# Patient Record
Sex: Male | Born: 1935 | Race: White | Hispanic: No | Marital: Married | Smoking: Current every day smoker
Health system: Southern US, Community
[De-identification: ages and names within clinical notes are randomized; demographics above are authoritative.]

## PROBLEM LIST (undated history)

## (undated) DIAGNOSIS — F0390 Unspecified dementia without behavioral disturbance: Secondary | ICD-10-CM

## (undated) DIAGNOSIS — T7840XA Allergy, unspecified, initial encounter: Secondary | ICD-10-CM

## (undated) DIAGNOSIS — E785 Hyperlipidemia, unspecified: Secondary | ICD-10-CM

## (undated) DIAGNOSIS — J189 Pneumonia, unspecified organism: Secondary | ICD-10-CM

## (undated) DIAGNOSIS — I10 Essential (primary) hypertension: Secondary | ICD-10-CM

## (undated) DIAGNOSIS — K469 Unspecified abdominal hernia without obstruction or gangrene: Secondary | ICD-10-CM

## (undated) HISTORY — DX: Allergy, unspecified, initial encounter: T78.40XA

## (undated) HISTORY — PX: CATARACT EXTRACTION: SUR2

## (undated) HISTORY — DX: Unspecified dementia, unspecified severity, without behavioral disturbance, psychotic disturbance, mood disturbance, and anxiety: F03.90

## (undated) HISTORY — DX: Hyperlipidemia, unspecified: E78.5

## (undated) HISTORY — DX: Essential (primary) hypertension: I10

---

## 2010-12-03 ENCOUNTER — Encounter: Payer: Self-pay | Admitting: Emergency Medicine

## 2010-12-03 ENCOUNTER — Emergency Department (HOSPITAL_COMMUNITY)
Admission: EM | Admit: 2010-12-03 | Discharge: 2010-12-03 | Disposition: A | Discharge status: Home / Self Care | Payer: Medicare Other | Attending: Emergency Medicine | Admitting: Emergency Medicine

## 2010-12-03 DIAGNOSIS — Z87891 Personal history of nicotine dependence: Secondary | ICD-10-CM | POA: Insufficient documentation

## 2010-12-03 DIAGNOSIS — K4090 Unilateral inguinal hernia, without obstruction or gangrene, not specified as recurrent: Secondary | ICD-10-CM

## 2010-12-03 DIAGNOSIS — K4030 Unilateral inguinal hernia, with obstruction, without gangrene, not specified as recurrent: Secondary | ICD-10-CM | POA: Insufficient documentation

## 2010-12-03 HISTORY — DX: Unspecified abdominal hernia without obstruction or gangrene: K46.9

## 2010-12-03 HISTORY — DX: Pneumonia, unspecified organism: J18.9

## 2010-12-03 NOTE — ED Provider Notes (Signed)
History     CSN: [id number] Arrival date & time: 12/03/2010  2:16 PM   First MD Initiated Contact with Patient 12/03/10 1418      Chief Complaint  Patient presents with  . Hernia    (Consider location/radiation/quality/duration/timing/severity/associated sxs/prior treatment) HPI Comments: Pt with known right inguinal hernia and today came out to the size of a baseball and was hard and would not go back in and then when he got here it reduced on it's own.  Patient is a 77 y.o. male presenting with groin pain. The history is provided by the patient.  Groin Pain This is a new problem. The current episode started 1 to 2 hours ago. The problem occurs constantly. The problem has been resolved. Associated symptoms include abdominal pain. The symptoms are aggravated by nothing. The symptoms are relieved by nothing.    Past Medical History  Diagnosis Date  . Hernia   . Pneumonia     Past Surgical History  Procedure Date  . Cataract extraction     History reviewed. No pertinent family history.  History  Substance Use Topics  . Smoking status: Former Games developer  . Smokeless tobacco: Not on file  . Alcohol Use: No      Review of Systems  Gastrointestinal: Positive for abdominal pain. Negative for nausea, vomiting and diarrhea.  All other systems reviewed and are negative.    Allergies  Review of patient's allergies indicates no known allergies.  Home Medications  No current outpatient prescriptions on file.  BP 126/69  Pulse 71  Temp(Src) 97.5 F (36.4 C) (Oral)  Resp 20  Ht 6' (1.829 m)  Wt 164 lb (74.39 kg)  BMI 22.24 kg/m2  SpO2 99%  Physical Exam  Nursing note and vitals reviewed. Constitutional: He is oriented to person, place, and time. He appears well-developed and well-nourished. No distress.  HENT:  Head: Normocephalic and atraumatic.  Mouth/Throat: Oropharynx is clear and moist.  Eyes: Conjunctivae and EOM are normal. Pupils are equal, round, and  reactive to light.  Neck: Normal range of motion. Neck supple.  Abdominal: Soft. He exhibits no distension. There is no tenderness. There is no rebound and no guarding. A hernia is present. Hernia confirmed positive in the right inguinal area.       Hernia is easily reducible and no abdominal or inguinal pain  Musculoskeletal: Normal range of motion. He exhibits no edema and no tenderness.  Neurological: He is alert and oriented to person, place, and time.  Skin: Skin is warm and dry. No rash noted. No erythema.  Psychiatric: He has a normal mood and affect. His behavior is normal.    ED Course  Procedures (including critical care time)  Labs Reviewed - No data to display No results found.   No diagnosis found.    MDM   Pt presenting due to an incarcerated hernia however by the time he got back to a room it had reduced.  He now has no abd pain or groin pain.  Reducible inguinal hernia on the right side.  Will have f/u with Dr. Caesar Bookman for repair and gave precautions.       Gwyneth Sprout, MD 12/03/10 2137

## 2010-12-03 NOTE — ED Notes (Signed)
r inguinal hernia x 2 yrs with no complications until suddenly today. Pt staets it is the size of a baseball. Pt states he is in excruciating pain. Calm at times and moaning at times. Pain with movement.

## 2010-12-03 NOTE — ED Notes (Signed)
Pt anxious at times. Comfort measures given.

## 2013-06-29 ENCOUNTER — Ambulatory Visit (INDEPENDENT_AMBULATORY_CARE_PROVIDER_SITE_OTHER): Payer: Medicare Other

## 2013-06-29 VITALS — BP 112/74 | HR 95 | Resp 16 | Ht 72.0 in | Wt 164.0 lb

## 2013-06-29 DIAGNOSIS — G2581 Restless legs syndrome: Secondary | ICD-10-CM

## 2013-06-29 DIAGNOSIS — G609 Hereditary and idiopathic neuropathy, unspecified: Secondary | ICD-10-CM

## 2013-06-29 DIAGNOSIS — G309 Alzheimer's disease, unspecified: Secondary | ICD-10-CM

## 2013-06-29 DIAGNOSIS — F0280 Dementia in other diseases classified elsewhere without behavioral disturbance: Secondary | ICD-10-CM

## 2013-06-29 DIAGNOSIS — G629 Polyneuropathy, unspecified: Secondary | ICD-10-CM

## 2013-06-29 MED ORDER — GABAPENTIN 300 MG PO CAPS: ORAL_CAPSULE | ORAL | Status: DC

## 2013-06-29 NOTE — Patient Instructions (Signed)

## 2013-06-29 NOTE — Progress Notes (Signed)
   Subjective:    Patient ID: Sean Day, male    DOB: 1933-01-29, 80 y.o.   MRN: [id number]  HPI Comments: "My feet get real cold and then they get hot"  Patient c/o aching, burning, numbness, tingling in both feet. Says that the whole foot feels this way and even parts of his lower leg. He feels that they get really cold and then feels like they are on fire. Worse in the evenings. He saw PCP and he Rx'd gabapentin 100 mg every night. He's been taking this for a couple months now and says its not helping.     Review of Systems  Constitutional: Positive for fatigue.  HENT: Positive for tinnitus.   Respiratory: Positive for apnea.   Cardiovascular:       Calf pain with walking  Musculoskeletal: Positive for gait problem.  All other systems reviewed and are negative.      Objective:   Physical Exam 80 year old white male presents at this time well-developed well-nourished however somewhat disoriented very forgetful and unable to stay on focused discussion. Continues to repeat some the same things and did not understands all the explanations. Patient presents with his daughter-in-law present during the visit record instructions. Lower extremity objective findings as follows vascular status is intact with pedal pulses palpable DP postal for PT plus one over 4 bilateral capillary refill time 3 seconds all digits epicritic sensations intact the dorsum of foot ankle and legs however his loss sensation the plantar aspect of the foot digits and mid arch and heel area bilateral. Patient also has intact vibratory sensation although has significant hyperesthesia very brisk tendon reflexes patellar and Achilles tendon reflexes are brisk patient has some hypersensitivity at times throughout the entire visit constantly fidgeting in moving and rubbing his feet together. Findings likely consistent with restless leg syndrome possibly associated with his Alzheimer's. Patient describing cold temperatures  however is wearing for pairs of socks with a shoes which are making his shoes too tight advised no more than 2 pair of sock and time. Orthopedic biomechanical exam rectus foot type digit no digital contractures good range of motion muscle strengths appear to be normal patient worked as a Chartered certified accountant does not denies any history of injury trauma to the back knees or hip or legs there is nice exposure to chemicals in the past.       Assessment & Plan:  Assessment idiopathic or hereditary neuropathy as noted with hypersensitivity restless legs as well as loss of sensation plantarly plan at this time patient was started on low very low level of gabapentin and does not have any improvement I feel he could lead to increase her titrated to a higher level of her time at this time initiated and prescribed gabapentin 300 mg tablets starting from one at bedtime and noted to a day than 3 a day and recheck within 1-2 months for reevaluation of his neuropathy. Patient has difficult time understanding this however his daughter-in-law was aware of the instructions will help him with those followup in one to 2 months for reevaluation

## 2013-07-31 ENCOUNTER — Telehealth: Payer: Self-pay | Admitting: Neurology

## 2013-07-31 NOTE — Telephone Encounter (Signed)
Pt son would like to talk to you before the patient comes in please call Maurice MarchLane at [phone]

## 2013-08-02 ENCOUNTER — Encounter: Payer: Self-pay | Admitting: *Deleted

## 2013-08-03 ENCOUNTER — Ambulatory Visit: Payer: Medicare Other

## 2013-08-07 ENCOUNTER — Ambulatory Visit (INDEPENDENT_AMBULATORY_CARE_PROVIDER_SITE_OTHER): Payer: Medicare Other | Admitting: Neurology

## 2013-08-07 ENCOUNTER — Encounter: Payer: Self-pay | Admitting: Neurology

## 2013-08-07 ENCOUNTER — Telehealth: Payer: Self-pay | Admitting: Neurology

## 2013-08-07 VITALS — BP 138/90 | HR 83 | Resp 16 | Ht 70.0 in | Wt 147.0 lb

## 2013-08-07 DIAGNOSIS — G309 Alzheimer's disease, unspecified: Principal | ICD-10-CM

## 2013-08-07 DIAGNOSIS — F0280 Dementia in other diseases classified elsewhere without behavioral disturbance: Secondary | ICD-10-CM

## 2013-08-07 LAB — TSH: TSH: 1.661 u[IU]/mL (ref 0.350–4.500)

## 2013-08-07 MED ORDER — MEMANTINE HCL 28 X 5 MG & 21 X 10 MG PO TABS: ORAL_TABLET | ORAL | Status: AC
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
## 2013-08-07 NOTE — Telephone Encounter (Signed)
Pt called requesting to speak to a nurse. C/b [phone]

## 2013-08-07 NOTE — Telephone Encounter (Signed)
Left message for patient son to call back

## 2013-08-07 NOTE — Patient Instructions (Addendum)
1.  We will start memantine ER (Namenda XR).  Take as directed to goal of 28mg  daily.  Side effects include dizziness, headache, diarrhea or constipation.  Call with any questions or concerns. 2.  [facility]   08/08/13 at 11:15 am 3.  Check TSH 4.  No driving. 5.  He should have 24 hour care.  Either his daughter is with him or consider assisted living. 6.  Would try to taper off Xanax by one tablet per week until off of it. 7.  Call in 4 weeks.  If tolerating the Namenda, we can start seroquel for agitation, hallucinations and problems sleeping.  Note that there is a black box warning with antipsychotics regarding increased mortality, but it is still used often as there are really no other alternatives. 8.  Follow up in 3 months.

## 2013-08-08 ENCOUNTER — Telehealth: Payer: Self-pay | Admitting: Neurology

## 2013-08-08 ENCOUNTER — Ambulatory Visit (HOSPITAL_COMMUNITY): Payer: Medicare Other | Attending: Neurology

## 2013-08-08 NOTE — Telephone Encounter (Signed)
Pt  Daughter karen needs to talk to someone about appt please call [phone]

## 2013-08-08 NOTE — Telephone Encounter (Signed)
Left message for patient daughter to return call.  

## 2013-08-10 ENCOUNTER — Telehealth: Payer: Self-pay | Admitting: *Deleted

## 2013-08-10 NOTE — Telephone Encounter (Signed)
Need to talk to you about a prescription he got on 06/06.  I returned her call.  He had a spell. His feet were hurting him really bad.  He had stopped taking the gabapentin because I thought he was trying to tell me his feet were hurting worse.  So I had told him to stop taking it.  I spoke to the pharmacist today and he said to start him back on it because he may not have taken it long enough to get it into his system.  I asked when did he stop them.. She said probably about a week after he started taking them.  She said they brought some of that Folic spray to put on them.  She said the pharmacist had suggested maybe he take some Vitamin B.  Is there anything else he can try.  I told her no.  She said will a cortisone shot help him.  I told her no, not for Neuropathy.  She asked if he should apply a heating pad.  I advised her no, see how the medicine works for him.  She stated okay, thanks for calling.

## 2013-08-20 ENCOUNTER — Telehealth: Payer: Self-pay | Admitting: *Deleted

## 2013-08-20 NOTE — Telephone Encounter (Signed)
I attempted to return her call.  I left her a message to call me back. 

## 2013-08-20 NOTE — Telephone Encounter (Signed)
Want the nurse to call his daughter-in-law in regards to his medication.  Want to clarify correct way to take it.  There's some misunderstanding on his part.

## 2013-08-21 NOTE — Telephone Encounter (Signed)
He's taking foot medications, Gabapen.  I have a question about that.  This is the second week of him taking this medication two a day.  Give me a call.

## 2013-08-21 NOTE — Telephone Encounter (Signed)
I called and informed her that Dr. Ralene CorkSikora said that any medication you take as the possibility of causing nausea.  Make sure he takes it with food.  She stated I've been trying to tell daddy to eat food when he takes his medicine.  She asked what did he say about him driving.  I told her he said as long as he's not experiencing any drowsiness or dizziness he should be okay.  She asked does he still want to see him.  I told her yes.  I transferred her to a scheduler.

## 2013-08-21 NOTE — Telephone Encounter (Signed)
I returned her call.  She stated her dad is taking the Gabapentin twice a day now, one in the morning and one at night.  My question is, can the Gabapentin cause nausea?  I think he's doing better being on the medication.  Next week he will start taking 3 pills a day.  I told her I would have to ask Dr. Ralene CorkSikora.  Is it okay for him to drive taking this medicine?  I asked her how does it make him feel.  Does it make him drowsy or dizzy.  She stated no, he's fine.  I told her then he should be okay to drive.  She said all he does is drive up to the store or to her house, no where long distance.  Does he want to see him back?  I told her he wants to see him back in the next week or so.  She said well ask him that question and call me back.  Then, I will schedule him an appointment for the week of the 11th.

## 2013-08-27 ENCOUNTER — Encounter (HOSPITAL_COMMUNITY): Payer: Self-pay | Admitting: Emergency Medicine

## 2013-08-27 ENCOUNTER — Inpatient Hospital Stay (HOSPITAL_COMMUNITY)
Admission: EM | Admit: 2013-08-27 | Discharge: 2013-08-29 | DRG: 552 | Disposition: A | Discharge status: Home / Self Care | Payer: Medicare Other | Attending: Internal Medicine | Admitting: Internal Medicine

## 2013-08-27 ENCOUNTER — Emergency Department (HOSPITAL_COMMUNITY): Payer: Medicare Other

## 2013-08-27 DIAGNOSIS — Z681 Body mass index (BMI) 19 or less, adult: Secondary | ICD-10-CM | POA: Diagnosis not present

## 2013-08-27 DIAGNOSIS — F17200 Nicotine dependence, unspecified, uncomplicated: Secondary | ICD-10-CM | POA: Diagnosis present

## 2013-08-27 DIAGNOSIS — Y92009 Unspecified place in unspecified non-institutional (private) residence as the place of occurrence of the external cause: Secondary | ICD-10-CM | POA: Diagnosis present

## 2013-08-27 DIAGNOSIS — E785 Hyperlipidemia, unspecified: Secondary | ICD-10-CM | POA: Diagnosis present

## 2013-08-27 DIAGNOSIS — I10 Essential (primary) hypertension: Secondary | ICD-10-CM | POA: Diagnosis present

## 2013-08-27 DIAGNOSIS — W19XXXA Unspecified fall, initial encounter: Secondary | ICD-10-CM | POA: Diagnosis present

## 2013-08-27 DIAGNOSIS — F1310 Sedative, hypnotic or anxiolytic abuse, uncomplicated: Secondary | ICD-10-CM | POA: Diagnosis present

## 2013-08-27 DIAGNOSIS — S12100A Unspecified displaced fracture of second cervical vertebra, initial encounter for closed fracture: Secondary | ICD-10-CM

## 2013-08-27 DIAGNOSIS — F1010 Alcohol abuse, uncomplicated: Secondary | ICD-10-CM | POA: Diagnosis present

## 2013-08-27 DIAGNOSIS — F0390 Unspecified dementia without behavioral disturbance: Secondary | ICD-10-CM | POA: Diagnosis present

## 2013-08-27 DIAGNOSIS — Z7982 Long term (current) use of aspirin: Secondary | ICD-10-CM

## 2013-08-27 DIAGNOSIS — F0280 Dementia in other diseases classified elsewhere without behavioral disturbance: Secondary | ICD-10-CM

## 2013-08-27 DIAGNOSIS — G309 Alzheimer's disease, unspecified: Secondary | ICD-10-CM

## 2013-08-27 DIAGNOSIS — E440 Moderate protein-calorie malnutrition: Secondary | ICD-10-CM

## 2013-08-27 DIAGNOSIS — F0391 Unspecified dementia with behavioral disturbance: Secondary | ICD-10-CM

## 2013-08-27 LAB — BASIC METABOLIC PANEL
ANION GAP: 14 (ref 5–15)
BUN: 9 mg/dL (ref 6–23)
CHLORIDE: 99 meq/L (ref 96–112)
CO2: 25 meq/L (ref 19–32)
CREATININE: 0.93 mg/dL (ref 0.50–1.35)
Calcium: 9.2 mg/dL (ref 8.4–10.5)
GFR calc Af Amer: >90 mL/min (ref >90)
GFR calc non Af Amer: 79 mL/min — ABNORMAL LOW (ref >90)
Glucose, Bld: 154 mg/dL — ABNORMAL HIGH (ref 70–99)
POTASSIUM: 3.8 meq/L (ref 3.7–5.3)
Sodium: 138 mEq/L (ref 137–147)

## 2013-08-27 LAB — URINALYSIS, ROUTINE W REFLEX MICROSCOPIC
BILIRUBIN URINE: NEGATIVE
Glucose, UA: NEGATIVE mg/dL
HGB URINE DIPSTICK: NEGATIVE
Ketones, ur: NEGATIVE mg/dL
Leukocytes, UA: NEGATIVE
NITRITE: NEGATIVE
PH: 6.5 (ref 5.0–8.0)
Protein, ur: NEGATIVE mg/dL
SPECIFIC GRAVITY, URINE: 1.004 — AB (ref 1.005–1.030)
Urobilinogen, UA: 0.2 mg/dL (ref 0.0–1.0)

## 2013-08-27 LAB — CBC WITH DIFFERENTIAL/PLATELET
Basophils Absolute: 0 10*3/uL (ref 0.0–0.1)
Basophils Relative: 0 % (ref 0–1)
Eosinophils Absolute: 0 10*3/uL (ref 0.0–0.7)
Eosinophils Relative: 0 % (ref 0–5)
HEMATOCRIT: 40.8 % (ref 39.0–52.0)
HEMOGLOBIN: 14.8 g/dL (ref 13.0–17.0)
LYMPHS ABS: 1.7 10*3/uL (ref 0.7–4.0)
LYMPHS PCT: 15 % (ref 12–46)
MCH: 33.9 pg (ref 26.0–34.0)
MCHC: 36.3 g/dL — ABNORMAL HIGH (ref 30.0–36.0)
MCV: 93.6 fL (ref 78.0–100.0)
MONO ABS: 1 10*3/uL (ref 0.1–1.0)
MONOS PCT: 9 % (ref 3–12)
NEUTROS ABS: 8.5 10*3/uL — AB (ref 1.7–7.7)
Neutrophils Relative %: 76 % (ref 43–77)
Platelets: 183 10*3/uL (ref 150–400)
RBC: 4.36 MIL/uL (ref 4.22–5.81)
RDW: 12 % (ref 11.5–15.5)
WBC: 11.2 10*3/uL — AB (ref 4.0–10.5)

## 2013-08-27 MED ORDER — LORAZEPAM 2 MG/ML IJ SOLN
1.0000 mg | Freq: Once | INTRAMUSCULAR | Status: AC
Start: 2013-08-27 — End: 2013-08-27
  Administered 2013-08-27: 1 mg via INTRAVENOUS
  Filled 2013-08-27: qty 1
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
## 2013-08-27 NOTE — ED Notes (Signed)
Pt is agitated and wants to leave. Son arrived at bedside; pt agitated more. Explained to pt that we are trying to help him and that he needs to calm and let us help him. Pt agrees to stay for a while longer.

## 2013-08-27 NOTE — ED Notes (Signed)
Pt belongings bag given to son

## 2013-08-27 NOTE — H&P (Signed)
[facility] History and Physical  Sean Day ZOX:[id number] DOB: 12-Nov-1933 DOA: 08/27/2013  Referring physician: EDP PCP: Sean Day., MD   Chief Complaint: fall brought in by his son.   HPI: Sean Day is a 80 y.o. male with prior h/o hypertension hyperlipidemia, dementia, brought in by his son for a fall. Most of the history was obtained from the EDP and very less from the patient. No family at bedside. Called son at [phone], nobody picked up. On arrival to ED, he was found to have elevated white count of 11,000, normal TSH, and negative UA. He underwent ct head and cervical spine, showing Type 2 fracture of the dens with 3 mm posterior displacement of the dens. Cervical collar was put in but Patient repeatedly wanted to take the collar off. He reports taking alcohol earlier this am. Neuro surgery was consulted and he was referred to medical service for admission.     Review of Systems:  Confused, could not be obtained.   Past Medical History  Diagnosis Date  . Hernia   . Pneumonia   . Hyperlipidemia   . Hypertension   . Allergy   . Dementia    Past Surgical History  Procedure Laterality Date  . Cataract extraction     Social History:  reports that he has been smoking Cigars.  He does not have any smokeless tobacco history on file. He reports that he does not drink alcohol or use illicit drugs.  No Known Allergies  No family history on file.   Prior to Admission medications   Medication Sig Start Date End Date Taking? Authorizing Provider  ALPRAZolam Prudy Feeler) 1 MG tablet Take 0.5 mg by mouth 4 (four) times daily as needed. For anxiety   Yes Historical Provider, MD  amLODipine (NORVASC) 10 MG tablet Take 10 mg by mouth daily.   Yes Historical Provider, MD  busPIRone (BUSPAR) 5 MG tablet Take 5 mg by mouth 2 (two) times daily.   Yes Historical Provider, MD  donepezil (ARICEPT) 10 MG tablet Take 10 mg by mouth at bedtime.   Yes Historical Provider, MD    gabapentin (NEURONTIN) 300 MG capsule Take 300 mg by mouth 3 (three) times daily.  06/29/13  Yes Richard Ralene Cork, DPM  pravastatin (PRAVACHOL) 20 MG tablet Take 20 mg by mouth daily.     Yes Historical Provider, MD  aspirin EC 81 MG tablet Take 81 mg by mouth daily.      Historical Provider, MD  memantine (NAMENDA TITRATION PAK) tablet pack 5 mg/day for =1 week; 5 mg twice daily for =1 week; 15 mg/day given in 5 mg and 10 mg separated doses for =1 week; then 10 mg twice daily 08/07/13   Cira Servant, DO   Physical Exam: Filed Vitals:   08/27/13 1742 08/27/13 2000 08/27/13 2030 08/27/13 2100  BP: 118/73 148/96 132/75 153/89  Pulse: 91 83  89  Temp: 98.3 F (36.8 C)     TempSrc: Oral     Resp: 20 18 18    SpO2: 95% 95%  96%    Wt Readings from Last 3 Encounters:  08/07/13 66.679 kg (147 lb)  06/29/13 74.39 kg (164 lb)  12/03/10 74.39 kg (164 lb)    General:  Appears calm and comfortable Eyes: PERRL, normal lids, irises & conjunctiva Neck: no LAD, masses or thyromegaly, cervical collar in place.  Cardiovascular: RRR, no m/r/g. No LE edema. Respiratory: CTA bilaterally, no w/r/r. Normal respiratory effort. Abdomen: soft, ntnd Skin:  no rash or induration seen on limited exam Musculoskeletal: grossly normal tone BUE/BLE Neurologic: confused, able to move all extremities, speech normal.           Labs on Admission:  Basic Metabolic Panel:  Recent Labs Lab 08/27/13 2040  NA 138  K 3.8  CL 99  CO2 25  GLUCOSE 154*  BUN 9  CREATININE 0.93  CALCIUM 9.2   Liver Function Tests: No results found for this basename: AST, ALT, ALKPHOS, BILITOT, PROT, ALBUMIN,  in the last 168 hours No results found for this basename: LIPASE, AMYLASE,  in the last 168 hours No results found for this basename: AMMONIA,  in the last 168 hours CBC:  Recent Labs Lab 08/27/13 2040  WBC 11.2*  NEUTROABS 8.5*  HGB 14.8  HCT 40.8  MCV 93.6  PLT 183   Cardiac Enzymes: No results found for  this basename: CKTOTAL, CKMB, CKMBINDEX, TROPONINI,  in the last 168 hours  BNP (last 3 results) No results found for this basename: PROBNP,  in the last 8760 hours CBG: No results found for this basename: GLUCAP,  in the last 168 hours  Radiological Exams on Admission: Dg Cervical Spine Complete  08/27/2013   CLINICAL DATA:  Pain post trauma  EXAM: CERVICAL SPINE  4+ VIEWS  COMPARISON:  None.  FINDINGS: All, lateral, open-mouth odontoid, and bilateral oblique views were obtained. There is a fracture through the base of the odontoid with mild posterior displacement of the odontoid with respect to the remainder of C2. No other fracture is apparent. Prevertebral soft tissues and predental space regions are normal. There is moderately severe disc space narrowing at C5-6 and C6-7. There is mild disc space narrowing at C3-4, C4-5, and C7-T1. There is facet hypertrophy to varying degrees at all levels consistent with multifocal osteoarthritis. There is carotid artery calcification bilaterally.  IMPRESSION: Displaced fracture of the base of the odontoid with displacement the odontoid posteriorly with respect to the remainder of C2. No other fractures. Multilevel osteoarthritic change. There is carotid artery calcification bilaterally.  Critical Value/emergent results were called by telephone at the time of interpretation on 08/27/2013 at 7:40 pm to Dr. Eber Hong , who verbally acknowledged these results.   Electronically Signed   By: Bretta Bang M.D.   On: 08/27/2013 19:40   Ct Head Wo Contrast  08/27/2013   CLINICAL DATA:  Fall.  Evaluate cervical fracture  EXAM: CT HEAD WITHOUT CONTRAST  CT CERVICAL SPINE WITHOUT CONTRAST  TECHNIQUE: Multidetector CT imaging of the head and cervical spine was performed following the standard protocol without intravenous contrast. Multiplanar CT image reconstructions of the cervical spine were also generated.  COMPARISON:  Cervical radiographs from today  FINDINGS: CT  HEAD FINDINGS  Generalized atrophy with chronic microvascular ischemia. Negative for acute infarct. Negative for intracranial hemorrhage or mass. Negative for skull fracture.  CT CERVICAL SPINE FINDINGS  Type 2 fracture of the dens with 3 mm posterior displacement of the dens. No significant spinal stenosis. No other acute fracture  Marked right foraminal encroachment due to uncinate spurring and facet hypertrophy. Moderate right foraminal encroachment at C4-5 due to spurring. Right foraminal narrowing at C5-6 due to spurring. Spondylosis with foraminal encroachment bilaterally at C6-7. 2 mm anterior slip C7-T1 secondary to facet degeneration.  IMPRESSION: No acute intracranial abnormality.  Type 2 fracture of the dens with 3 mm posterior displacement of the dens.   Electronically Signed   By: Marlan Palau M.D.   On:  08/27/2013 21:50   Ct Cervical Spine Wo Contrast  08/27/2013   CLINICAL DATA:  Fall.  Evaluate cervical fracture  EXAM: CT HEAD WITHOUT CONTRAST  CT CERVICAL SPINE WITHOUT CONTRAST  TECHNIQUE: Multidetector CT imaging of the head and cervical spine was performed following the standard protocol without intravenous contrast. Multiplanar CT image reconstructions of the cervical spine were also generated.  COMPARISON:  Cervical radiographs from today  FINDINGS: CT HEAD FINDINGS  Generalized atrophy with chronic microvascular ischemia. Negative for acute infarct. Negative for intracranial hemorrhage or mass. Negative for skull fracture.  CT CERVICAL SPINE FINDINGS  Type 2 fracture of the dens with 3 mm posterior displacement of the dens. No significant spinal stenosis. No other acute fracture  Marked right foraminal encroachment due to uncinate spurring and facet hypertrophy. Moderate right foraminal encroachment at C4-5 due to spurring. Right foraminal narrowing at C5-6 due to spurring. Spondylosis with foraminal encroachment bilaterally at C6-7. 2 mm anterior slip C7-T1 secondary to facet degeneration.   IMPRESSION: No acute intracranial abnormality.  Type 2 fracture of the dens with 3 mm posterior displacement of the dens.   Electronically Signed   By: Marlan Palauharles  Clark M.D.   On: 08/27/2013 21:50    EKG: sinus rhythm  Assessment/Plan Active Problems:   C2 cervical fracture   Fall  1. Fall with C2 cervical fracture: - admit to neuro telemetry. Neurosurgery consulted.  - pain control as needed.  - probably secondary to alcohol intoxication.  - CIWA protocol ordered.  - cervical collar in place.   Code Status: full code DVT Prophylaxis: SCD'S Family Communication: none at bedside Disposition Plan: admit to neuro tele  Time spent: 60 min  [facility]ULA,Lindey Renzulli [facility] Pager [phone]  **Disclaimer: This note may have been dictated with voice recognition software. Similar sounding words can inadvertently be transcribed and this note may contain transcription errors which may not have been corrected upon publication of note.**

## 2013-08-27 NOTE — ED Provider Notes (Addendum)
CSN: [id number]     Arrival date & time 08/27/13  1730 History   First MD Initiated Contact with Patient 08/27/13 1751     Chief Complaint  Patient presents with  . Alcohol Intoxication     (Consider location/radiation/quality/duration/timing/severity/associated sxs/prior Treatment) HPI Comments: Pt comes in with cc of neck pain.  Per RN report: "Pt's son called EMS due to pt being intoxicated and having neck pain. EMS states the pt's son found the pt lying under his truck in order to get shade. The son states the pt complained of neck pain when getting up from the ground. EMS states the family is concerned about the pt's alcohol use and states he has had thoughts of hurting himself. The pt denies SI/HI and states he has only had 2-3 beers today. EMS states the pt was alert to person and place when they arrived. Pt is A&Ox4 and in NAD upon arrival."  Patient denies any SI, HI to me. He reports that he had 3 glasses of beer, and was laying out, relaxing. He has no psych hx. Pt has no neck pain, headaches and denies numbness, tingling.  Patient is a 80 y.o. male presenting with intoxication. The history is provided by the patient.  Alcohol Intoxication Pertinent negatives include no chest pain, no abdominal pain and no shortness of breath.    Past Medical History  Diagnosis Date  . Hernia   . Pneumonia   . Hyperlipidemia   . Hypertension   . Allergy   . Dementia    Past Surgical History  Procedure Laterality Date  . Cataract extraction     No family history on file. History  Substance Use Topics  . Smoking status: Current Every Day Smoker    Types: Cigars  . Smokeless tobacco: Not on file  . Alcohol Use: No    Review of Systems  Constitutional: Negative for activity change and appetite change.  Respiratory: Negative for cough and shortness of breath.   Cardiovascular: Negative for chest pain.  Gastrointestinal: Negative for abdominal pain.  Genitourinary: Negative for  dysuria.  Psychiatric/Behavioral: Negative for suicidal ideas and self-injury. The patient is not nervous/anxious.       Allergies  Review of patient's allergies indicates no known allergies.  Home Medications   Prior to Admission medications   Medication Sig Start Date End Date Taking? Authorizing Provider  ALPRAZolam Prudy Feeler) 1 MG tablet Take 0.5 mg by mouth 4 (four) times daily as needed. For anxiety   Yes Historical Provider, MD  amLODipine (NORVASC) 10 MG tablet Take 10 mg by mouth daily.   Yes Historical Provider, MD  busPIRone (BUSPAR) 5 MG tablet Take 5 mg by mouth 2 (two) times daily.   Yes Historical Provider, MD  donepezil (ARICEPT) 10 MG tablet Take 10 mg by mouth at bedtime.   Yes Historical Provider, MD  gabapentin (NEURONTIN) 300 MG capsule Take 300 mg by mouth 3 (three) times daily.  06/29/13  Yes Richard Ralene Cork, DPM  pravastatin (PRAVACHOL) 20 MG tablet Take 20 mg by mouth daily.     Yes Historical Provider, MD  aspirin EC 81 MG tablet Take 81 mg by mouth daily.      Historical Provider, MD  memantine [facility] TITRATION PAK) tablet pack 5 mg/day for =1 week; 5 mg twice daily for =1 week; 15 mg/day given in 5 mg and 10 mg separated doses for =1 week; then 10 mg twice daily 08/07/13   Cira Servant, DO   BP  153/89  Pulse 89  Temp(Src) 98.3 F (36.8 C) (Oral)  Resp 18  SpO2 96% Physical Exam  Nursing note and vitals reviewed. Constitutional: He is oriented to person, place, and time. He appears well-developed.  HENT:  Head: Normocephalic and atraumatic.  Eyes: Conjunctivae and EOM are normal. Pupils are equal, round, and reactive to light.  Neck: Normal range of motion. Neck supple.  No midline c-spine tenderness, pt able to turn head to 45 degrees bilaterally without any pain and able to flex neck to the chest and extend without any pain or neurologic symptoms.   Cardiovascular: Normal rate and regular rhythm.   Pulmonary/Chest: Effort normal and breath sounds  normal.  Abdominal: Soft. Bowel sounds are normal. He exhibits no distension. There is no tenderness. There is no rebound and no guarding.  Neurological: He is alert and oriented to person, place, and time.  Skin: Skin is warm.    ED Course  Procedures (including critical care time) Labs Review Labs Reviewed  CBC WITH DIFFERENTIAL - Abnormal; Notable for the following:    WBC 11.2 (*)    MCHC 36.3 (*)    Neutro Abs 8.5 (*)    All other components within normal limits  BASIC METABOLIC PANEL - Abnormal; Notable for the following:    Glucose, Bld 154 (*)    GFR calc non Af Amer 79 (*)    All other components within normal limits  URINALYSIS, ROUTINE W REFLEX MICROSCOPIC - Abnormal; Notable for the following:    Specific Gravity, Urine 1.004 (*)    All other components within normal limits    Imaging Review Dg Cervical Spine Complete  08/27/2013   CLINICAL DATA:  Pain post trauma  EXAM: CERVICAL SPINE  4+ VIEWS  COMPARISON:  None.  FINDINGS: All, lateral, open-mouth odontoid, and bilateral oblique views were obtained. There is a fracture through the base of the odontoid with mild posterior displacement of the odontoid with respect to the remainder of C2. No other fracture is apparent. Prevertebral soft tissues and predental space regions are normal. There is moderately severe disc space narrowing at C5-6 and C6-7. There is mild disc space narrowing at C3-4, C4-5, and C7-T1. There is facet hypertrophy to varying degrees at all levels consistent with multifocal osteoarthritis. There is carotid artery calcification bilaterally.  IMPRESSION: Displaced fracture of the base of the odontoid with displacement the odontoid posteriorly with respect to the remainder of C2. No other fractures. Multilevel osteoarthritic change. There is carotid artery calcification bilaterally.  Critical Value/emergent results were called by telephone at the time of interpretation on 08/27/2013 at 7:40 pm to Dr. Eber HongBRIAN MILLER ,  who verbally acknowledged these results.   Electronically Signed   By: Bretta BangWilliam  Woodruff M.D.   On: 08/27/2013 19:40   Ct Head Wo Contrast  08/27/2013   CLINICAL DATA:  Fall.  Evaluate cervical fracture  EXAM: CT HEAD WITHOUT CONTRAST  CT CERVICAL SPINE WITHOUT CONTRAST  TECHNIQUE: Multidetector CT imaging of the head and cervical spine was performed following the standard protocol without intravenous contrast. Multiplanar CT image reconstructions of the cervical spine were also generated.  COMPARISON:  Cervical radiographs from today  FINDINGS: CT HEAD FINDINGS  Generalized atrophy with chronic microvascular ischemia. Negative for acute infarct. Negative for intracranial hemorrhage or mass. Negative for skull fracture.  CT CERVICAL SPINE FINDINGS  Type 2 fracture of the dens with 3 mm posterior displacement of the dens. No significant spinal stenosis. No other acute fracture  Marked  right foraminal encroachment due to uncinate spurring and facet hypertrophy. Moderate right foraminal encroachment at C4-5 due to spurring. Right foraminal narrowing at C5-6 due to spurring. Spondylosis with foraminal encroachment bilaterally at C6-7. 2 mm anterior slip C7-T1 secondary to facet degeneration.  IMPRESSION: No acute intracranial abnormality.  Type 2 fracture of the dens with 3 mm posterior displacement of the dens.   Electronically Signed   By: Marlan Palau M.D.   On: 08/27/2013 21:50   Ct Cervical Spine Wo Contrast  08/27/2013   CLINICAL DATA:  Fall.  Evaluate cervical fracture  EXAM: CT HEAD WITHOUT CONTRAST  CT CERVICAL SPINE WITHOUT CONTRAST  TECHNIQUE: Multidetector CT imaging of the head and cervical spine was performed following the standard protocol without intravenous contrast. Multiplanar CT image reconstructions of the cervical spine were also generated.  COMPARISON:  Cervical radiographs from today  FINDINGS: CT HEAD FINDINGS  Generalized atrophy with chronic microvascular ischemia. Negative for acute  infarct. Negative for intracranial hemorrhage or mass. Negative for skull fracture.  CT CERVICAL SPINE FINDINGS  Type 2 fracture of the dens with 3 mm posterior displacement of the dens. No significant spinal stenosis. No other acute fracture  Marked right foraminal encroachment due to uncinate spurring and facet hypertrophy. Moderate right foraminal encroachment at C4-5 due to spurring. Right foraminal narrowing at C5-6 due to spurring. Spondylosis with foraminal encroachment bilaterally at C6-7. 2 mm anterior slip C7-T1 secondary to facet degeneration.  IMPRESSION: No acute intracranial abnormality.  Type 2 fracture of the dens with 3 mm posterior displacement of the dens.   Electronically Signed   By: Marlan Palau M.D.   On: 08/27/2013 21:50     EKG Interpretation None      MDM   Final diagnoses:  Dens fracture, closed, initial encounter  Dementia, with behavioral disturbance    Pt here for intoxication, ? SI, and neck pain. No neck pain for me. Xrays ordered - pt refusing. Removed collar. Will not listen to [place]. No midline cspine tenderness, and we agreed that we will remove the collar if he lets [place] get the xrays.  [phone]  - called the # in file, as patient states that his son in law called the EMS - and there is no response.   Patient is clinically sober. He is talking coherently, gait is normal, and is demonstrating rational thought process. He has no SI, HI.  Will discharge.  Derwood Kaplan, MD 08/27/13 1911  10:16 PM Pt got the Cspine xrays and has Dens fracrure, with displacement. Aspen collar on. Full neuro exam done again - and he is moving all 4, and has intact sensation and equal reflexes. Spoke with Dr. Threasa Heads, N'surgery - pt OK for d/c with soft collar. Close clinic f/u.  Spoke with son, who is also POA - and he is very uncomfortable taking his dad home. His father has been very uncooperative in the ER as well. Pt lives by himself, and refuses to live anywhere  else. Father also doesn't have 24/7 supervision, and in addition he is going through some intoxication issues and benzo withdrawals.  Pt will be admitted given his dementia, and inability to follow simple commands and high risk of falls. Medicine to admit. Nsuegery consulted.  CRITICAL CARE Performed by: Derwood Kaplan   Total critical care time: 90 minutes - FOR EXTENSIVE DISCUSSION WITH ALL PARTIES INVOLVED IN CARE OF MR. Koop AND HIS INJURY THAT CARRIES SUBSTANTIAL COMORBIDITIES IF IT BECOMES UNSTABLE.  Critical care time  was exclusive of separately billable procedures and treating other patients.  Critical care was necessary to treat or prevent imminent or life-threatening deterioration.  Critical care was time spent personally by me on the following activities: development of treatment plan with patient and/or surrogate as well as nursing, discussions with consultants, evaluation of patient's response to treatment, examination of patient, obtaining history from patient or surrogate, ordering and performing treatments and interventions, ordering and review of laboratory studies, ordering and review of radiographic studies, pulse oximetry and re-evaluation of patient's condition.    Derwood Kaplan, MD 08/27/13 2219

## 2013-08-28 DIAGNOSIS — E440 Moderate protein-calorie malnutrition: Secondary | ICD-10-CM | POA: Insufficient documentation

## 2013-08-28 DIAGNOSIS — F0391 Unspecified dementia with behavioral disturbance: Secondary | ICD-10-CM

## 2013-08-28 DIAGNOSIS — F03918 Unspecified dementia, unspecified severity, with other behavioral disturbance: Secondary | ICD-10-CM

## 2013-08-28 LAB — PROTIME-INR
INR: 1 (ref 0.00–1.49)
Prothrombin Time: 13.2 seconds (ref 11.6–15.2)

## 2013-08-28 LAB — ETHANOL: Alcohol, Ethyl (B): <11 mg/dL (ref 0–11)

## 2013-08-28 LAB — BASIC METABOLIC PANEL
Anion gap: 12 (ref 5–15)
BUN: 10 mg/dL (ref 6–23)
CHLORIDE: 102 meq/L (ref 96–112)
CO2: 26 mEq/L (ref 19–32)
Calcium: 9.2 mg/dL (ref 8.4–10.5)
Creatinine, Ser: 0.82 mg/dL (ref 0.50–1.35)
GFR calc Af Amer: >90 mL/min (ref >90)
GFR calc non Af Amer: 83 mL/min — ABNORMAL LOW (ref >90)
GLUCOSE: 105 mg/dL — AB (ref 70–99)
POTASSIUM: 4 meq/L (ref 3.7–5.3)
Sodium: 140 mEq/L (ref 137–147)

## 2013-08-28 LAB — CBC
HCT: 41.7 % (ref 39.0–52.0)
HEMOGLOBIN: 14.9 g/dL (ref 13.0–17.0)
MCH: 33.8 pg (ref 26.0–34.0)
MCHC: 35.7 g/dL (ref 30.0–36.0)
MCV: 94.6 fL (ref 78.0–100.0)
Platelets: 187 10*3/uL (ref 150–400)
RBC: 4.41 MIL/uL (ref 4.22–5.81)
RDW: 12.2 % (ref 11.5–15.5)
WBC: 11.6 10*3/uL — ABNORMAL HIGH (ref 4.0–10.5)

## 2013-08-28 MED ORDER — SIMVASTATIN 5 MG PO TABS: 10.0000 mg | ORAL_TABLET | Freq: Every day | ORAL | Status: DC

## 2013-08-28 MED ORDER — THIAMINE HCL 100 MG/ML IJ SOLN: 100.0000 mg | Freq: Every day | INTRAMUSCULAR | Status: DC

## 2013-08-28 MED ORDER — DONEPEZIL HCL 10 MG PO TABS
10.0000 mg | ORAL_TABLET | Freq: Every day | ORAL | Status: DC
  Administered 2013-08-28 (×2): 10 mg via ORAL
  Filled 2013-08-28 (×2): qty 1

## 2013-08-28 MED ORDER — ACETAMINOPHEN 650 MG RE SUPP: 650.0000 mg | Freq: Four times a day (QID) | RECTAL | Status: DC | PRN

## 2013-08-28 MED ORDER — ADULT MULTIVITAMIN W/MINERALS CH
1.0000 | ORAL_TABLET | Freq: Every day | ORAL | Status: DC
  Administered 2013-08-28 – 2013-08-29 (×2): 1 via ORAL
  Filled 2013-08-28 (×2): qty 1

## 2013-08-28 MED ORDER — SODIUM CHLORIDE 0.9 % IV SOLN
INTRAVENOUS | Status: DC
  Administered 2013-08-28: 1000 mL via INTRAVENOUS

## 2013-08-28 MED ORDER — ACETAMINOPHEN 325 MG PO TABS
650.0000 mg | ORAL_TABLET | Freq: Four times a day (QID) | ORAL | Status: DC | PRN
  Administered 2013-08-29: 650 mg via ORAL
  Filled 2013-08-28: qty 2

## 2013-08-28 MED ORDER — VITAMIN B-1 100 MG PO TABS
100.0000 mg | ORAL_TABLET | Freq: Every day | ORAL | Status: DC
  Administered 2013-08-28 – 2013-08-29 (×2): 100 mg via ORAL
  Filled 2013-08-28 (×2): qty 1

## 2013-08-28 MED ORDER — ALPRAZOLAM 0.5 MG PO TABS
0.5000 mg | ORAL_TABLET | Freq: Four times a day (QID) | ORAL | Status: DC | PRN
  Administered 2013-08-28: 0.5 mg via ORAL
  Filled 2013-08-28: qty 1

## 2013-08-28 MED ORDER — LORAZEPAM 1 MG PO TABS: 1.0000 mg | ORAL_TABLET | Freq: Four times a day (QID) | ORAL | Status: DC | PRN

## 2013-08-28 MED ORDER — ENSURE COMPLETE PO LIQD
237.0000 mL | Freq: Three times a day (TID) | ORAL | Status: DC
  Administered 2013-08-28: 237 mL via ORAL

## 2013-08-28 MED ORDER — AMLODIPINE BESYLATE 10 MG PO TABS
10.0000 mg | ORAL_TABLET | Freq: Every day | ORAL | Status: DC
  Administered 2013-08-28 – 2013-08-29 (×2): 10 mg via ORAL
  Filled 2013-08-28 (×2): qty 1

## 2013-08-28 MED ORDER — BUSPIRONE HCL 10 MG PO TABS
5.0000 mg | ORAL_TABLET | Freq: Two times a day (BID) | ORAL | Status: DC
  Administered 2013-08-28 – 2013-08-29 (×4): 5 mg via ORAL
  Filled 2013-08-28 (×4): qty 1

## 2013-08-28 MED ORDER — FOLIC ACID 1 MG PO TABS
1.0000 mg | ORAL_TABLET | Freq: Every day | ORAL | Status: DC
  Administered 2013-08-28 – 2013-08-29 (×2): 1 mg via ORAL
  Filled 2013-08-28 (×2): qty 1

## 2013-08-28 MED ORDER — MORPHINE SULFATE 2 MG/ML IJ SOLN
1.0000 mg | INTRAMUSCULAR | Status: DC | PRN
  Administered 2013-08-28 – 2013-08-29 (×4): 1 mg via INTRAVENOUS
  Filled 2013-08-28 (×3): qty 1

## 2013-08-28 MED ORDER — GABAPENTIN 300 MG PO CAPS
300.0000 mg | ORAL_CAPSULE | Freq: Three times a day (TID) | ORAL | Status: DC
  Administered 2013-08-28 – 2013-08-29 (×3): 300 mg via ORAL
  Filled 2013-08-28 (×3): qty 1

## 2013-08-28 MED ORDER — LORAZEPAM 2 MG/ML IJ SOLN
1.0000 mg | Freq: Four times a day (QID) | INTRAMUSCULAR | Status: DC | PRN
  Administered 2013-08-28 – 2013-08-29 (×3): 1 mg via INTRAVENOUS
  Filled 2013-08-28 (×3): qty 1

## 2013-08-28 NOTE — Progress Notes (Signed)
UR complete.  Tamarah Bhullar RN, MSN 

## 2013-08-28 NOTE — Progress Notes (Signed)
[facility] PROGRESS NOTE Interim History: 80 y.o. male with prior h/o hypertension hyperlipidemia, dementia, brought in by his son for a fall.. On arrival to ED, he was found to have elevated white count of 11,000, normal TSH, and negative UA. He underwent ct head and cervical spine, showing Type 2 fracture of the dens with 3 mm posterior displacement of the dens.   Assessment/Plan: C2 cervical fracture - Awaiting neurosurgery recommendations (Dr. Threasa Heads spoke with ED). - pt consult pending. - pain control as needed.  - probably secondary to alcohol intoxication.  - CIWA protocol ordered.  - cervical collar in place.   ETOH USE: - monitor with CIWA, on thiamine and folate.  Code Status: full code  DVT Prophylaxis: SCD'S  Family Communication: none at bedside  Disposition Plan: admit to neuro tele    Consultants:  neurosurgery  Procedures:  CT neck and head  Antibiotics:  None  HPI/Subjective: Neck pain, but medication help with pain.  Objective: Filed Vitals:   08/27/13 2100 08/28/13 0001 08/28/13 0217 08/28/13 0702  BP: 153/89 145/90 137/72 149/87  Pulse: 89 78 89 88  Temp:  98.3 F (36.8 C) 98.5 F (36.9 C) 97.9 F (36.6 C)  TempSrc:  Oral Oral Oral  Resp:  18 18 18   Height:  6' (1.829 m)    Weight:  63.186 kg (139 lb 4.8 oz)    SpO2: 96% 96% 94% 97%   No intake or output data in the 24 hours ending 08/28/13 0841 Filed Weights   08/28/13 0001  Weight: 63.186 kg (139 lb 4.8 oz)    Exam:  General: Alert, awake, oriented x3. HEENT: No bruits, no goiter.  Heart: Regular rate and rhythm. Lungs: Good air movement, clear Abdomen: Soft, nontender, nondistended, positive bowel sounds.  Neuro: Grossly intact, nonfocal.   Data Reviewed: Basic Metabolic Panel:  Recent Labs Lab 08/27/13 2040 08/28/13 0050  NA 138 140  K 3.8 4.0  CL 99 102  CO2 25 26  GLUCOSE 154* 105*  BUN 9 10  CREATININE 0.93 0.82  CALCIUM 9.2 9.2   Liver  Function Tests: No results found for this basename: AST, ALT, ALKPHOS, BILITOT, PROT, ALBUMIN,  in the last 168 hours No results found for this basename: LIPASE, AMYLASE,  in the last 168 hours No results found for this basename: AMMONIA,  in the last 168 hours CBC:  Recent Labs Lab 08/27/13 2040 08/28/13 0050  WBC 11.2* 11.6*  NEUTROABS 8.5*  --   HGB 14.8 14.9  HCT 40.8 41.7  MCV 93.6 94.6  PLT 183 187   Cardiac Enzymes: No results found for this basename: CKTOTAL, CKMB, CKMBINDEX, TROPONINI,  in the last 168 hours BNP (last 3 results) No results found for this basename: PROBNP,  in the last 8760 hours CBG: No results found for this basename: GLUCAP,  in the last 168 hours  No results found for this or any previous visit (from the past 240 hour(s)).   Studies: Dg Cervical Spine Complete  08/27/2013   CLINICAL DATA:  Pain post trauma  EXAM: CERVICAL SPINE  4+ VIEWS  COMPARISON:  None.  FINDINGS: All, lateral, open-mouth odontoid, and bilateral oblique views were obtained. There is a fracture through the base of the odontoid with mild posterior displacement of the odontoid with respect to the remainder of C2. No other fracture is apparent. Prevertebral soft tissues and predental space regions are normal. There is moderately severe disc space narrowing at C5-6 and C6-7. There  is mild disc space narrowing at C3-4, C4-5, and C7-T1. There is facet hypertrophy to varying degrees at all levels consistent with multifocal osteoarthritis. There is carotid artery calcification bilaterally.  IMPRESSION: Displaced fracture of the base of the odontoid with displacement the odontoid posteriorly with respect to the remainder of C2. No other fractures. Multilevel osteoarthritic change. There is carotid artery calcification bilaterally.  Critical Value/emergent results were called by telephone at the time of interpretation on 08/27/2013 at 7:40 pm to Dr. Eber HongBRIAN MILLER , who verbally acknowledged these  results.   Electronically Signed   By: Bretta BangWilliam  Woodruff M.D.   On: 08/27/2013 19:40   Ct Head Wo Contrast  08/27/2013   CLINICAL DATA:  Fall.  Evaluate cervical fracture  EXAM: CT HEAD WITHOUT CONTRAST  CT CERVICAL SPINE WITHOUT CONTRAST  TECHNIQUE: Multidetector CT imaging of the head and cervical spine was performed following the standard protocol without intravenous contrast. Multiplanar CT image reconstructions of the cervical spine were also generated.  COMPARISON:  Cervical radiographs from today  FINDINGS: CT HEAD FINDINGS  Generalized atrophy with chronic microvascular ischemia. Negative for acute infarct. Negative for intracranial hemorrhage or mass. Negative for skull fracture.  CT CERVICAL SPINE FINDINGS  Type 2 fracture of the dens with 3 mm posterior displacement of the dens. No significant spinal stenosis. No other acute fracture  Marked right foraminal encroachment due to uncinate spurring and facet hypertrophy. Moderate right foraminal encroachment at C4-5 due to spurring. Right foraminal narrowing at C5-6 due to spurring. Spondylosis with foraminal encroachment bilaterally at C6-7. 2 mm anterior slip C7-T1 secondary to facet degeneration.  IMPRESSION: No acute intracranial abnormality.  Type 2 fracture of the dens with 3 mm posterior displacement of the dens.   Electronically Signed   By: Marlan Palauharles  Clark M.D.   On: 08/27/2013 21:50   Ct Cervical Spine Wo Contrast  08/27/2013   CLINICAL DATA:  Fall.  Evaluate cervical fracture  EXAM: CT HEAD WITHOUT CONTRAST  CT CERVICAL SPINE WITHOUT CONTRAST  TECHNIQUE: Multidetector CT imaging of the head and cervical spine was performed following the standard protocol without intravenous contrast. Multiplanar CT image reconstructions of the cervical spine were also generated.  COMPARISON:  Cervical radiographs from today  FINDINGS: CT HEAD FINDINGS  Generalized atrophy with chronic microvascular ischemia. Negative for acute infarct. Negative for intracranial  hemorrhage or mass. Negative for skull fracture.  CT CERVICAL SPINE FINDINGS  Type 2 fracture of the dens with 3 mm posterior displacement of the dens. No significant spinal stenosis. No other acute fracture  Marked right foraminal encroachment due to uncinate spurring and facet hypertrophy. Moderate right foraminal encroachment at C4-5 due to spurring. Right foraminal narrowing at C5-6 due to spurring. Spondylosis with foraminal encroachment bilaterally at C6-7. 2 mm anterior slip C7-T1 secondary to facet degeneration.  IMPRESSION: No acute intracranial abnormality.  Type 2 fracture of the dens with 3 mm posterior displacement of the dens.   Electronically Signed   By: Marlan Palauharles  Clark M.D.   On: 08/27/2013 21:50    Scheduled Meds: . amLODipine  10 mg Oral Daily  . busPIRone  5 mg Oral BID  . donepezil  10 mg Oral QHS  . folic acid  1 mg Oral Daily  . gabapentin  300 mg Oral TID  . multivitamin with minerals  1 tablet Oral Daily  . simvastatin  10 mg Oral q1800  . thiamine  100 mg Oral Daily   Or  . thiamine  100 mg Intravenous  Daily   Continuous Infusions: . sodium chloride 1,000 mL (08/28/13 0030)     Marinda Elk  [facility] Pager [phone]. If 8PM-8AM, please contact night-coverage at [email or web address], password [facility] 08/28/2013, 8:41 AM  LOS: 1 day      **Disclaimer: This note may have been dictated with voice recognition software. Similar sounding words can inadvertently be transcribed and this note may contain transcription errors which may not have been corrected upon publication of note.**

## 2013-08-28 NOTE — Consult Note (Signed)
Reason for Consult:C2 Referring Physician: dilraj, killgore is an 80 y.o. male.  HPI: whom fell yesterday sustaining a C2 fracture without neurologic deficit. History of alcohol abuse, xanax abuse.   Past Medical History  Diagnosis Date  . Hernia   . Pneumonia   . Hyperlipidemia   . Hypertension   . Allergy   . Dementia     Past Surgical History  Procedure Laterality Date  . Cataract extraction      No family history on file.  Social History:  reports that he has been smoking Cigars.  He does not have any smokeless tobacco history on file. He reports that he does not drink alcohol or use illicit drugs.  Allergies: No Known Allergies  Medications: I have reviewed the patient's current medications.  Results for orders placed during the hospital encounter of 08/27/13 (from the past 48 hour(s))  CBC WITH DIFFERENTIAL     Status: Abnormal   Collection Time    08/27/13  8:40 PM      Result Value Ref Range   WBC 11.2 (*) 4.0 - 10.5 K/uL   RBC 4.36  4.22 - 5.81 MIL/uL   Hemoglobin 14.8  13.0 - 17.0 g/dL   HCT 40.8  39.0 - 52.0 %   MCV 93.6  78.0 - 100.0 fL   MCH 33.9  26.0 - 34.0 pg   MCHC 36.3 (*) 30.0 - 36.0 g/dL   RDW 12.0  11.5 - 15.5 %   Platelets 183  150 - 400 K/uL   Neutrophils Relative % 76  43 - 77 %   Neutro Abs 8.5 (*) 1.7 - 7.7 K/uL   Lymphocytes Relative 15  12 - 46 %   Lymphs Abs 1.7  0.7 - 4.0 K/uL   Monocytes Relative 9  3 - 12 %   Monocytes Absolute 1.0  0.1 - 1.0 K/uL   Eosinophils Relative 0  0 - 5 %   Eosinophils Absolute 0.0  0.0 - 0.7 K/uL   Basophils Relative 0  0 - 1 %   Basophils Absolute 0.0  0.0 - 0.1 K/uL  BASIC METABOLIC PANEL     Status: Abnormal   Collection Time    08/27/13  8:40 PM      Result Value Ref Range   Sodium 138  137 - 147 mEq/L   Potassium 3.8  3.7 - 5.3 mEq/L   Chloride 99  96 - 112 mEq/L   CO2 25  19 - 32 mEq/L   Glucose, Bld 154 (*) 70 - 99 mg/dL   BUN 9  6 - 23 mg/dL   Creatinine, Ser 0.93  0.50 - 1.35  mg/dL   Calcium 9.2  8.4 - 10.5 mg/dL   GFR calc non Af Amer 79 (*) >90 mL/min   GFR calc Af Amer >90  >90 mL/min   Comment: (NOTE)     The eGFR has been calculated using the CKD EPI equation.     This calculation has not been validated in all clinical situations.     eGFR's persistently <90 mL/min signify possible Chronic Kidney     Disease.   Anion gap 14  5 - 15  URINALYSIS, ROUTINE W REFLEX MICROSCOPIC     Status: Abnormal   Collection Time    08/27/13  9:31 PM      Result Value Ref Range   Color, Urine YELLOW  YELLOW   APPearance CLEAR  CLEAR   Specific Gravity, Urine 1.004 (*)  1.005 - 1.030   pH 6.5  5.0 - 8.0   Glucose, UA NEGATIVE  NEGATIVE mg/dL   Hgb urine dipstick NEGATIVE  NEGATIVE   Bilirubin Urine NEGATIVE  NEGATIVE   Ketones, ur NEGATIVE  NEGATIVE mg/dL   Protein, ur NEGATIVE  NEGATIVE mg/dL   Urobilinogen, UA 0.2  0.0 - 1.0 mg/dL   Nitrite NEGATIVE  NEGATIVE   Leukocytes, UA NEGATIVE  NEGATIVE   Comment: MICROSCOPIC NOT DONE ON URINES WITH NEGATIVE PROTEIN, BLOOD, LEUKOCYTES, NITRITE, OR GLUCOSE <1000 mg/dL.  BASIC METABOLIC PANEL     Status: Abnormal   Collection Time    08/28/13 12:50 AM      Result Value Ref Range   Sodium 140  137 - 147 mEq/L   Potassium 4.0  3.7 - 5.3 mEq/L   Chloride 102  96 - 112 mEq/L   CO2 26  19 - 32 mEq/L   Glucose, Bld 105 (*) 70 - 99 mg/dL   BUN 10  6 - 23 mg/dL   Creatinine, Ser 0.82  0.50 - 1.35 mg/dL   Calcium 9.2  8.4 - 10.5 mg/dL   GFR calc non Af Amer 83 (*) >90 mL/min   GFR calc Af Amer >90  >90 mL/min   Comment: (NOTE)     The eGFR has been calculated using the CKD EPI equation.     This calculation has not been validated in all clinical situations.     eGFR's persistently <90 mL/min signify possible Chronic Kidney     Disease.   Anion gap 12  5 - 15  CBC     Status: Abnormal   Collection Time    08/28/13 12:50 AM      Result Value Ref Range   WBC 11.6 (*) 4.0 - 10.5 K/uL   RBC 4.41  4.22 - 5.81 MIL/uL    Hemoglobin 14.9  13.0 - 17.0 g/dL   HCT 41.7  39.0 - 52.0 %   MCV 94.6  78.0 - 100.0 fL   MCH 33.8  26.0 - 34.0 pg   MCHC 35.7  30.0 - 36.0 g/dL   RDW 12.2  11.5 - 15.5 %   Platelets 187  150 - 400 K/uL  PROTIME-INR     Status: None   Collection Time    08/28/13 12:50 AM      Result Value Ref Range   Prothrombin Time 13.2  11.6 - 15.2 seconds   INR 1.00  0.00 - 1.49  ETHANOL     Status: None   Collection Time    08/28/13 12:50 AM      Result Value Ref Range   Alcohol, Ethyl (B) <11  0 - 11 mg/dL   Comment:            LOWEST DETECTABLE LIMIT FOR     SERUM ALCOHOL IS 11 mg/dL     FOR MEDICAL PURPOSES ONLY    Dg Cervical Spine Complete  08/27/2013   CLINICAL DATA:  Pain post trauma  EXAM: CERVICAL SPINE  4+ VIEWS  COMPARISON:  None.  FINDINGS: All, lateral, open-mouth odontoid, and bilateral oblique views were obtained. There is a fracture through the base of the odontoid with mild posterior displacement of the odontoid with respect to the remainder of C2. No other fracture is apparent. Prevertebral soft tissues and predental space regions are normal. There is moderately severe disc space narrowing at C5-6 and C6-7. There is mild disc space narrowing at C3-4, C4-5, and C7-T1.  There is facet hypertrophy to varying degrees at all levels consistent with multifocal osteoarthritis. There is carotid artery calcification bilaterally.  IMPRESSION: Displaced fracture of the base of the odontoid with displacement the odontoid posteriorly with respect to the remainder of C2. No other fractures. Multilevel osteoarthritic change. There is carotid artery calcification bilaterally.  Critical Value/emergent results were called by telephone at the time of interpretation on 08/27/2013 at 7:40 pm to Dr. Noemi Chapel , who verbally acknowledged these results.   Electronically Signed   By: Lowella Grip M.D.   On: 08/27/2013 19:40   Ct Head Wo Contrast  08/27/2013   CLINICAL DATA:  Fall.  Evaluate cervical  fracture  EXAM: CT HEAD WITHOUT CONTRAST  CT CERVICAL SPINE WITHOUT CONTRAST  TECHNIQUE: Multidetector CT imaging of the head and cervical spine was performed following the standard protocol without intravenous contrast. Multiplanar CT image reconstructions of the cervical spine were also generated.  COMPARISON:  Cervical radiographs from today  FINDINGS: CT HEAD FINDINGS  Generalized atrophy with chronic microvascular ischemia. Negative for acute infarct. Negative for intracranial hemorrhage or mass. Negative for skull fracture.  CT CERVICAL SPINE FINDINGS  Type 2 fracture of the dens with 3 mm posterior displacement of the dens. No significant spinal stenosis. No other acute fracture  Marked right foraminal encroachment due to uncinate spurring and facet hypertrophy. Moderate right foraminal encroachment at C4-5 due to spurring. Right foraminal narrowing at C5-6 due to spurring. Spondylosis with foraminal encroachment bilaterally at C6-7. 2 mm anterior slip C7-T1 secondary to facet degeneration.  IMPRESSION: No acute intracranial abnormality.  Type 2 fracture of the dens with 3 mm posterior displacement of the dens.   Electronically Signed   By: Franchot Gallo M.D.   On: 08/27/2013 21:50   Ct Cervical Spine Wo Contrast  08/27/2013   CLINICAL DATA:  Fall.  Evaluate cervical fracture  EXAM: CT HEAD WITHOUT CONTRAST  CT CERVICAL SPINE WITHOUT CONTRAST  TECHNIQUE: Multidetector CT imaging of the head and cervical spine was performed following the standard protocol without intravenous contrast. Multiplanar CT image reconstructions of the cervical spine were also generated.  COMPARISON:  Cervical radiographs from today  FINDINGS: CT HEAD FINDINGS  Generalized atrophy with chronic microvascular ischemia. Negative for acute infarct. Negative for intracranial hemorrhage or mass. Negative for skull fracture.  CT CERVICAL SPINE FINDINGS  Type 2 fracture of the dens with 3 mm posterior displacement of the dens. No  significant spinal stenosis. No other acute fracture  Marked right foraminal encroachment due to uncinate spurring and facet hypertrophy. Moderate right foraminal encroachment at C4-5 due to spurring. Right foraminal narrowing at C5-6 due to spurring. Spondylosis with foraminal encroachment bilaterally at C6-7. 2 mm anterior slip C7-T1 secondary to facet degeneration.  IMPRESSION: No acute intracranial abnormality.  Type 2 fracture of the dens with 3 mm posterior displacement of the dens.   Electronically Signed   By: Franchot Gallo M.D.   On: 08/27/2013 21:50    ROS Blood pressure 131/62, pulse 113, temperature 98.5 F (36.9 C), temperature source Oral, resp. rate 18, height 6' (1.829 m), weight 63.186 kg (139 lb 4.8 oz), SpO2 96.00%. Physical Exam  Constitutional: He is oriented to person, place, and time. He appears well-developed and well-nourished.  HENT:  Head: Normocephalic.  Right Ear: External ear normal.  Left Ear: External ear normal.  Eyes: Conjunctivae and EOM are normal. Pupils are equal, round, and reactive to light.  Neck:  In cervical collar  Respiratory: Effort  normal and breath sounds normal.  GI: Soft.  Neurological: He is alert and oriented to person, place, and time. He has normal strength. No cranial nerve deficit or sensory deficit. He displays no Babinski's sign on the right side. He displays no Babinski's sign on the left side.  Moving all extremities  Following commands Gait not assessed, moving upper extremities in a coordinated fashion  Psychiatric:  Can be combative, calm at the moment    Assessment/Plan: No surgical stabilization at this time. The cervical collar is sufficient treatment. I have spoken with his son and explained my rationale. Would be a good candidate for a xanax withdrawal program.   Shakenya Stoneberg L 08/28/2013, 8:00 PM

## 2013-08-28 NOTE — Progress Notes (Signed)
INITIAL NUTRITION ASSESSMENT  DOCUMENTATION CODES Per approved criteria  -Non-severe (moderate) malnutrition in the context of social or environmental circumstances  Pt meets criteria for MODERATE MALNUTRITION in the context of Social or Environmental Circumstances as evidenced by 15% weight loss in less than 2 months, moderate muscle wasting, and estimated energy intake < 75% of estimated energy intake for > 3 months .  INTERVENTION: Multivitamin with minerals, thiamine, and folic acid supplements already in place Provide Ensure Complete TID   NUTRITION DIAGNOSIS: Inadequate oral intake related to social issues and lack of taste as evidenced by 15% weight loss in less than 2 months.     Goal: Pt to meet >/= 90% of their estimated nutrition needs   Monitor:  PO intake, weight trend, labs  Reason for Assessment: Malnutrition screening Tool, score of 4  80 y.o. male  Admitting Dx: <principal problem not specified>  ASSESSMENT: 80 y.o. male with prior h/o hypertension hyperlipidemia, dementia, brought in by his son for a fall. Most of the history was obtained from the EDP and very less from the patient. On arrival to ED, he was found to have elevated white count of 11,000, normal TSH, and negative UA. He underwent ct head and cervical spine, showing Type 2 fracture of the dens with 3 mm posterior displacement of the dens. Cervical collar was put in but patient repeatedly wanted to take the collar off. He reports taking alcohol earlier this am.  Weight history shows that pt has lost 25 lbs, 15% of his body weight, in the past 2 months. Per nursing notes, pt consumed 100% of breakfast and lunch today. Pt reports that he used to weigh 165 lbs back in the winter time. He states that for the past several months he has been eating about 50% less than he used to due to lack of taste and due to life stress (he states he has had issues with his son since his son took away his car and car keys). Pt  states he recently started drinking Ensure supplements up to 3 times per day. He reports that his appetite is a little better today and he ate well.   Nutrition Focused Physical Exam:  Subcutaneous Fat:  Orbital Region: wnl Upper Arm Region: mild wasting Thoracic and Lumbar Region: NA  Muscle:  Temple Region: mild wasting Clavicle Bone Region: moderate wasting Clavicle and Acromion Bone Region: moderate wasting Scapular Bone Region: NA Dorsal Hand: mild wasting Patellar Region: moderate wasting Anterior Thigh Region: moderate wasting Posterior Calf Region: moderate wasting  Edema: none noted  Height: Ht Readings from Last 1 Encounters:  08/28/13 6' (1.829 m)    Weight: Wt Readings from Last 1 Encounters:  08/28/13 139 lb 4.8 oz (63.186 kg)    Ideal Body Weight: 178 lbs  % Ideal Body Weight: 78%  Wt Readings from Last 10 Encounters:  08/28/13 139 lb 4.8 oz (63.186 kg)  08/07/13 147 lb (66.679 kg)  06/29/13 164 lb (74.39 kg)  12/03/10 164 lb (74.39 kg)    Usual Body Weight: 164 lbs  % Usual Body Weight: 85%  BMI:  Body mass index is 18.89 kg/(m^2).  Estimated Nutritional Needs: Kcal: 1800-2000 Protein: 70-80 grams Fluid: 1.9 L/day  Skin: intact  Diet Order: Cardiac  EDUCATION NEEDS: -No education needs identified at this time   Intake/Output Summary (Last 24 hours) at 08/28/13 1411 Last data filed at 08/28/13 1341  Gross per 24 hour  Intake    480 ml  Output  600 ml  Net   -120 ml    Last BM: 8/3  Labs:   Recent Labs Lab 08/27/13 2040 08/28/13 0050  NA 138 140  K 3.8 4.0  CL 99 102  CO2 25 26  BUN 9 10  CREATININE 0.93 0.82  CALCIUM 9.2 9.2  GLUCOSE 154* 105*    CBG (last 3)  No results found for this basename: GLUCAP,  in the last 72 hours  Scheduled Meds: . amLODipine  10 mg Oral Daily  . busPIRone  5 mg Oral BID  . donepezil  10 mg Oral QHS  . folic acid  1 mg Oral Daily  . gabapentin  300 mg Oral TID  . multivitamin  with minerals  1 tablet Oral Daily  . simvastatin  10 mg Oral q1800  . thiamine  100 mg Oral Daily   Or  . thiamine  100 mg Intravenous Daily    Continuous Infusions: . sodium chloride 1,000 mL (08/28/13 0030)    Past Medical History  Diagnosis Date  . Hernia   . Pneumonia   . Hyperlipidemia   . Hypertension   . Allergy   . Dementia     Past Surgical History  Procedure Laterality Date  . Cataract extraction      Ian Malkin RD, LDN Inpatient Clinical Dietitian Pager: [phone] After Hours Pager: [phone]

## 2013-08-28 NOTE — Progress Notes (Signed)
Pt is agitated. CIWA score of 9, PRN Ativan 1mg  given. Will continue to monitor patient.

## 2013-08-29 DIAGNOSIS — E440 Moderate protein-calorie malnutrition: Secondary | ICD-10-CM

## 2013-08-29 MED ORDER — HYDROCODONE-ACETAMINOPHEN 5-325 MG PO TABS: 1.0000 | ORAL_TABLET | Freq: Four times a day (QID) | ORAL | Status: AC | PRN

## 2013-08-29 NOTE — Progress Notes (Signed)
Patient agitated  Walked up and down hall way with staff CIWA 9 1mg  ativan administered will continue to monitor.

## 2013-08-29 NOTE — Evaluation (Signed)
Occupational Therapy Evaluation Patient Details Name: Sean Day MRN: [id number] DOB: Jun 15, 1933 Today's Date: 08/29/2013    History of Present Illness 80 y.o. male whom fell yesterday sustaining a C2 fracture without neurologic deficit. History of alcohol abuse, xanax abuse.  Per neuro note No surgical stabilization at this time. The cervical collar is sufficient treatment.   Clinical Impression   Pt s/p above. Education provided to pt and family. Pt planning to d/c today.     Follow Up Recommendations  No OT follow up;Supervision/Assistance - 24 hour    Equipment Recommendations  None recommended by OT    Recommendations for Other Services       Precautions / Restrictions Precautions Precautions: Cervical;Fall Precaution Booklet Issued:  (given by PT) Precaution Comments: pt reports 3 falls in past 3 months; reviewed cervical precautions handout with pt; reviewed precautions Required Braces or Orthoses: Cervical Brace Cervical Brace: Hard collar;At all times Restrictions Weight Bearing Restrictions: No      Mobility Bed Mobility Overal bed mobility: Needs Assistance Bed Mobility: Rolling;Sidelying to Sit;Sit to Sidelying Rolling: Supervision Sidelying to sit: Supervision     Sit to sidelying: Supervision General bed mobility comments: cues for log roll technique.  Transfers Overall transfer level: Needs assistance Equipment used: 1 person hand held assist Transfers: Sit to/from Stand Sit to Stand: Supervision         General transfer comment: cues for hand placement.         ADL Overall ADL's : Needs assistance/impaired                     Lower Body Dressing: Supervision/safety;Sit to/from stand;Set up   Toilet Transfer: Supervision/safety;Ambulation;RW;Comfort height toilet;Grab bars           Functional mobility during ADLs: Supervision/safety;Rolling walker (ambulated a little without walker) General ADL Comments: Educated on  cervical collar and pads. Educated on safety tips for home (rugs, safe shoewear, use of bag on walker). Recommended sitting for LB ADLs. Educated on use of cup for teeth care and use of straw. Recommended someone be with pt for shower transfer. Educated on use of button up shirts.      Vision                     Perception     Praxis      Pertinent Vitals/Pain No apparent distress.     Hand Dominance     Extremity/Trunk Assessment Upper Extremity Assessment Upper Extremity Assessment: Overall WFL for tasks assessed   Lower Extremity Assessment Lower Extremity Assessment: Defer to PT evaluation   Cervical / Trunk Assessment Cervical / Trunk Assessment: Normal   Communication Communication Communication: No difficulties   Cognition Arousal/Alertness: Awake/alert Behavior During Therapy: Impulsive Overall Cognitive Status: History of cognitive impairments - at baseline     Safety/Judgement: Decreased awareness of safety     General Comments       Exercises       Shoulder Instructions      Home Living Family/patient expects to be discharged to:: Private residence Living Arrangements: Alone Available Help at Discharge: Family;Available 24 hours/day;Other (Comment) (daughter states they are trying to work it out) Type of Home: House Home Access: Stairs to enter Secretary/administratorntrance Stairs-Number of Steps: 1-2 Entrance Stairs-Rails: None Home Layout: One level     Bathroom Shower/Tub: Walk-in shower;Door   Foot LockerBathroom Toilet: Handicapped height     Home Equipment: [employer] Service[employer] CompanyCane - quad  Prior Functioning/Environment Level of Independence: Independent             OT Diagnosis:     OT Problem List:     OT Treatment/Interventions:      OT Goals(Current goals can be found in the care plan section) Acute Rehab OT Goals Patient Stated Goal: go home  OT Frequency:     Barriers to D/C:            Co-evaluation              End of Session  Equipment Utilized During Treatment: Rolling walker;Cervical collar Nurse Communication: Other (comment) (needs extra set of pads for collar; RW)  Activity Tolerance: Patient tolerated treatment well Patient left: in chair;with family/visitor present;with nursing/sitter in room   Time: 1331-1400 OT Time Calculation (min): 29 min Charges:  OT General Charges $OT Visit: 1 Procedure OT Evaluation $Initial OT Evaluation Tier I: 1 Procedure OT Treatments $Self Care/Home Management : 8-22 mins G-CodesEarlie Raveling OTR/L [phone] 08/29/2013, 2:32 PM

## 2013-08-29 NOTE — Evaluation (Signed)
Physical Therapy Evaluation Patient Details Name: Sean Day MRN: [id number] DOB: 1933/01/27 Today's Date: 08/29/2013   History of Present Illness  80 y.o. male whom fell yesterday sustaining a C2 fracture without neurologic deficit. History of alcohol abuse, xanax abuse.  Per neuro note No surgical stabilization at this time. The cervical collar is sufficient treatment.  Clinical Impression  Pt adm due to above. Presents with decr independence with gt and mobility and is a high fall risk due to decr safety awareness and balance deficits. Pt to benefit from skilled acute PT to address deficits below and maximize functional mobility prior to returning home. Spoke extensively with pt and MD regarding recommendations of 24/7 (A) from family upon D/C home due to high risk of falls. Pt reports his daughter can stay with him as needed. Will also recommend RW and HHPT upon acute D/C for safety and to improve balance. Pt will benefit from OT consult to address safety with ADLs.   Follow Up Recommendations Home health PT;Supervision/Assistance - 24 hour    Equipment Recommendations  Rolling walker with 5" wheels    Recommendations for Other Services OT consult     Precautions / Restrictions Precautions Precautions: Cervical;Fall Precaution Booklet Issued: Yes (comment) Precaution Comments: pt reports 3 falls in past 3 months; reviewed cervical precautions handout with pt Required Braces or Orthoses: Cervical Brace Cervical Brace: Hard collar;At all times Restrictions Weight Bearing Restrictions: No      Mobility  Bed Mobility Overal bed mobility: Needs Assistance Bed Mobility: Rolling;Sidelying to Sit Rolling: Supervision Sidelying to sit: Supervision       General bed mobility comments: cues for log rolling technique and to discontinue use of handdrails to simulate home envrioment   Transfers Overall transfer level: Needs assistance Equipment used: 1 person hand held  assist Transfers: Sit to/from Stand Sit to Stand: Min guard         General transfer comment: pt with sway with transfer to standing; min guard to steady  Ambulation/Gait Ambulation/Gait assistance: Min assist;Min guard Ambulation Distance (Feet): 175 Feet Assistive device: 1 person hand held assist Gait Pattern/deviations: Step-through pattern;Decreased stride length;Narrow base of support;Shuffle;Scissoring Gait velocity: initially impulsive; cues to slow down for safety    General Gait Details: pt very unsteady with gt; multiple LOB and required min (A) x 3 to maintain balance; pt easily distracted; could be due to prior balance deficits vs recent adm of ativan per RN; recommend use of RW to pt and to MD upon D/C home. will require 24/7 (A) initially due to high risk of falls; max cues for safety   Stairs Stairs: Yes Stairs assistance: Min guard Stair Management: No rails;Step to pattern;Forwards Number of Stairs: 3 General stair comments: max cues for safety and min guard to steady due to balance deficits on steps; will require (A) to ambulate steps into house   Wheelchair Mobility    Modified Rankin (Stroke Patients Only)       Balance Overall balance assessment: History of Falls;Needs assistance Sitting-balance support: Feet supported;No upper extremity supported Sitting balance-Leahy Scale: Good     Standing balance support: During functional activity;Single extremity supported Standing balance-Leahy Scale: Poor Standing balance comment: pt with sway; required handheld (A) to maintain balance              High level balance activites: Direction changes;Sudden stops;Turns High Level Balance Comments: min guard to min (A) to balance; pt with scissor like gt at times  Pertinent Vitals/Pain Pt adm ativan by RN prior to session; no c/o pain     Home Living Family/patient expects to be discharged to:: Private residence Living Arrangements:  Alone Available Help at Discharge: Family;Available 24 hours/day;Other (Comment) (reports he can have daughter stay with him) Type of Home: House Home Access: Stairs to enter Entrance Stairs-Rails: None Entrance Stairs-Number of Steps: 1-2 Home Layout: One level Home Equipment: Cane - quad      Prior Function Level of Independence: Independent               Hand Dominance        Extremity/Trunk Assessment   Upper Extremity Assessment: Defer to OT evaluation           Lower Extremity Assessment: Generalized weakness (c/o peripheral neuropathy )      Cervical / Trunk Assessment: Normal  Communication   Communication: No difficulties  Cognition Arousal/Alertness: Awake/alert Behavior During Therapy: Impulsive Overall Cognitive Status: No family/caregiver present to determine baseline cognitive functioning Area of Impairment: Following commands;Safety/judgement;Problem solving     Memory: Decreased recall of precautions Following Commands: Follows one step commands with increased time Safety/Judgement: Decreased awareness of safety;Decreased awareness of deficits   Problem Solving: Slow processing;Difficulty sequencing;Requires verbal cues;Requires tactile cues General Comments: pt unaware of balance deficits; continuously stated "i can do things on my own" but was requiring min guard to min (A) for mobility     General Comments      Exercises        Assessment/Plan    PT Assessment Patient needs continued PT services  PT Diagnosis Abnormality of gait;Generalized weakness;Acute pain   PT Problem List Decreased strength  PT Treatment Interventions DME instruction;Gait training;Stair training;Functional mobility training;Therapeutic activities;Therapeutic exercise;Balance training;Neuromuscular re-education;Patient/family education   PT Goals (Current goals can be found in the Care Plan section) Acute Rehab PT Goals Patient Stated Goal: go home now PT  Goal Formulation: With patient Time For Goal Achievement: 09/01/13 Potential to Achieve Goals: Good    Frequency Min 5X/week   Barriers to discharge Inaccessible home environment;Decreased caregiver support lives alone with STE; reports his daughter can stay with him if needed    Co-evaluation               End of Session Equipment Utilized During Treatment: Gait belt;Cervical collar Activity Tolerance: Patient tolerated treatment well Patient left: in chair;with call bell/phone within reach;with nursing/sitter in room Nurse Communication: Mobility status;Precautions         Time: 1010-1025 PT Time Calculation (min): 15 min   Charges:   PT Evaluation $Initial PT Evaluation Tier I: 1 Procedure PT Treatments $Gait Training: 8-22 mins   PT G CodesDonell Day:          Male Minish N, [place]PT  [phone] 08/29/2013, 12:08 PM

## 2013-08-29 NOTE — Discharge Summary (Addendum)
Physician Discharge Summary  Sean Day XBJ:[id number] DOB: 27-Jan-1933 DOA: 08/27/2013  PCP: Cassell SmilesFUSCO,LAWRENCE J., MD  Admit date: 08/27/2013 Discharge date: 08/29/2013  Time spent: 35 minutes  Recommendations for Outpatient Follow-up:  1. Follow up with PCP in 2 week. 2. NEurosurgery in 2-4 weeks.  Discharge Diagnoses:  Active Problems:   C2 cervical fracture   Fall   Malnutrition of moderate degree   Moderate protein malnutrition   Discharge Condition: Stable  Diet recommendation: heart healthy  Filed Weights   08/28/13 0001  Weight: 63.186 kg (139 lb 4.8 oz)    History of present illness:  80 y.o. male with prior h/o hypertension hyperlipidemia, dementia, brought in by his son for a fall. Most of the history was obtained from the EDP and very less from the patient. No family at bedside. Called son at [phone], nobody picked up. On arrival to ED, he was found to have elevated white count of 11,000, normal TSH, and negative UA. He underwent ct head and cervical spine, showing Type 2 fracture of the dens with 3 mm posterior displacement of the dens. Cervical collar was put in but Patient repeatedly wanted to take the collar off. He reports taking alcohol earlier this am. Neuro surgery was consulted and he was referred to medical service for admission.    Hospital Course:  C2 cervical fracture  - Neurosurgery recommended conservative management pain control and collar. - pain control as needed.  - Cervical collar in place.  - follow up with Neurosurgery as an outpatient  ETOH USE:  - monitor with CIWA, on thiamine and folate.   Procedures:  CT head and neck  Consultations:  neurosurgery  Discharge Exam: Filed Vitals:   08/29/13 0144  BP: 110/62  Pulse: 99  Temp: 98.3 F (36.8 C)  Resp: 16    General: A&O x3 Cardiovascular: RRR Respiratory: good air movement CTA B/l  Discharge Instructions You were cared for by a hospitalist during your hospital stay. If  you have any questions about your discharge medications or the care you received while you were in the hospital after you are discharged, you can call the unit and asked to speak with the hospitalist on call if the hospitalist that took care of you is not available. Once you are discharged, your primary care physician will handle any further medical issues. Please note that NO REFILLS for any discharge medications will be authorized once you are discharged, as it is imperative that you return to your primary care physician (or establish a relationship with a primary care physician if you do not have one) for your aftercare needs so that they can reassess your need for medications and monitor your lab values.      Discharge Instructions   Diet - low sodium heart healthy    Complete by:  As directed      Diet - low sodium heart healthy    Complete by:  As directed      Increase activity slowly    Complete by:  As directed      Increase activity slowly    Complete by:  As directed             Medication List         ALPRAZolam 1 MG tablet  Commonly known as:  XANAX  Take 0.5 mg by mouth 4 (four) times daily as needed. For anxiety     amLODipine 10 MG tablet  Commonly known as:  NORVASC  Take 10 mg by mouth daily.     aspirin EC 81 MG tablet  Take 81 mg by mouth daily.     busPIRone 5 MG tablet  Commonly known as:  BUSPAR  Take 5 mg by mouth 2 (two) times daily.     donepezil 10 MG tablet  Commonly known as:  ARICEPT  Take 10 mg by mouth at bedtime.     gabapentin 300 MG capsule  Commonly known as:  NEURONTIN  Take 300 mg by mouth 3 (three) times daily.     HYDROcodone-acetaminophen 5-325 MG per tablet  Commonly known as:  NORCO  Take 1 tablet by mouth every 6 (six) hours as needed for moderate pain.     memantine tablet pack  Commonly known as:  NAMENDA TITRATION PAK  5 mg/day for =1 week; 5 mg twice daily for =1 week; 15 mg/day given in 5 mg and 10 mg separated doses  for =1 week; then 10 mg twice daily     pravastatin 20 MG tablet  Commonly known as:  PRAVACHOL  Take 20 mg by mouth daily.       No Known Allergies Follow-up Information   Follow up with Cassell Smiles., MD In 2 weeks. (hospital follow up)    Specialty:  Internal Medicine   Contact information:   [address] [place] [address] [phone]        The results of significant diagnostics from this hospitalization (including imaging, microbiology, ancillary and laboratory) are listed below for reference.    Significant Diagnostic Studies: Dg Cervical Spine Complete  08/27/2013   CLINICAL DATA:  Pain post trauma  EXAM: CERVICAL SPINE  4+ VIEWS  COMPARISON:  None.  FINDINGS: All, lateral, open-mouth odontoid, and bilateral oblique views were obtained. There is a fracture through the base of the odontoid with mild posterior displacement of the odontoid with respect to the remainder of C2. No other fracture is apparent. Prevertebral soft tissues and predental space regions are normal. There is moderately severe disc space narrowing at C5-6 and C6-7. There is mild disc space narrowing at C3-4, C4-5, and C7-T1. There is facet hypertrophy to varying degrees at all levels consistent with multifocal osteoarthritis. There is carotid artery calcification bilaterally.  IMPRESSION: Displaced fracture of the base of the odontoid with displacement the odontoid posteriorly with respect to the remainder of C2. No other fractures. Multilevel osteoarthritic change. There is carotid artery calcification bilaterally.  Critical Value/emergent results were called by telephone at the time of interpretation on 08/27/2013 at 7:40 pm to Dr. Eber Hong , who verbally acknowledged these results.   Electronically Signed   By: Bretta Bang M.D.   On: 08/27/2013 19:40   Ct Head Wo Contrast  08/27/2013   CLINICAL DATA:  Fall.  Evaluate cervical fracture  EXAM: CT HEAD WITHOUT CONTRAST  CT CERVICAL SPINE  WITHOUT CONTRAST  TECHNIQUE: Multidetector CT imaging of the head and cervical spine was performed following the standard protocol without intravenous contrast. Multiplanar CT image reconstructions of the cervical spine were also generated.  COMPARISON:  Cervical radiographs from today  FINDINGS: CT HEAD FINDINGS  Generalized atrophy with chronic microvascular ischemia. Negative for acute infarct. Negative for intracranial hemorrhage or mass. Negative for skull fracture.  CT CERVICAL SPINE FINDINGS  Type 2 fracture of the dens with 3 mm posterior displacement of the dens. No significant spinal stenosis. No other acute fracture  Marked right foraminal encroachment due to uncinate spurring and facet hypertrophy. Moderate right  foraminal encroachment at C4-5 due to spurring. Right foraminal narrowing at C5-6 due to spurring. Spondylosis with foraminal encroachment bilaterally at C6-7. 2 mm anterior slip C7-T1 secondary to facet degeneration.  IMPRESSION: No acute intracranial abnormality.  Type 2 fracture of the dens with 3 mm posterior displacement of the dens.   Electronically Signed   By: Marlan Palau M.D.   On: 08/27/2013 21:50   Ct Cervical Spine Wo Contrast  08/27/2013   CLINICAL DATA:  Fall.  Evaluate cervical fracture  EXAM: CT HEAD WITHOUT CONTRAST  CT CERVICAL SPINE WITHOUT CONTRAST  TECHNIQUE: Multidetector CT imaging of the head and cervical spine was performed following the standard protocol without intravenous contrast. Multiplanar CT image reconstructions of the cervical spine were also generated.  COMPARISON:  Cervical radiographs from today  FINDINGS: CT HEAD FINDINGS  Generalized atrophy with chronic microvascular ischemia. Negative for acute infarct. Negative for intracranial hemorrhage or mass. Negative for skull fracture.  CT CERVICAL SPINE FINDINGS  Type 2 fracture of the dens with 3 mm posterior displacement of the dens. No significant spinal stenosis. No other acute fracture  Marked right  foraminal encroachment due to uncinate spurring and facet hypertrophy. Moderate right foraminal encroachment at C4-5 due to spurring. Right foraminal narrowing at C5-6 due to spurring. Spondylosis with foraminal encroachment bilaterally at C6-7. 2 mm anterior slip C7-T1 secondary to facet degeneration.  IMPRESSION: No acute intracranial abnormality.  Type 2 fracture of the dens with 3 mm posterior displacement of the dens.   Electronically Signed   By: Marlan Palau M.D.   On: 08/27/2013 21:50    Microbiology: No results found for this or any previous visit (from the past 240 hour(s)).   Labs: Basic Metabolic Panel:  Recent Labs Lab 08/27/13 2040 08/28/13 0050  NA 138 140  K 3.8 4.0  CL 99 102  CO2 25 26  GLUCOSE 154* 105*  BUN 9 10  CREATININE 0.93 0.82  CALCIUM 9.2 9.2   Liver Function Tests: No results found for this basename: AST, ALT, ALKPHOS, BILITOT, PROT, ALBUMIN,  in the last 168 hours No results found for this basename: LIPASE, AMYLASE,  in the last 168 hours No results found for this basename: AMMONIA,  in the last 168 hours CBC:  Recent Labs Lab 08/27/13 2040 08/28/13 0050  WBC 11.2* 11.6*  NEUTROABS 8.5*  --   HGB 14.8 14.9  HCT 40.8 41.7  MCV 93.6 94.6  PLT 183 187   Cardiac Enzymes: No results found for this basename: CKTOTAL, CKMB, CKMBINDEX, TROPONINI,  in the last 168 hours BNP: BNP (last 3 results) No results found for this basename: PROBNP,  in the last 8760 hours CBG: No results found for this basename: GLUCAP,  in the last 168 hours     Signed:  Marinda Elk  [facility] 08/29/2013, 9:42 AM

## 2013-08-29 NOTE — Progress Notes (Signed)
Paged MD regarding signing patient's scripts

## 2013-08-30 NOTE — Care Management Note (Signed)
    Page 1 of 1   08/30/2013     3:42:26 PM CARE MANAGEMENT NOTE 08/30/2013  Patient:  Sean Day,Sean Day   Account Number:  [id number]  Date Initiated:  08/28/2013  Documentation initiated by:  Elmer BalesOBARGE,COURTNEY  Subjective/Objective Assessment:   Patient admitted with C2 fracture, s/p fall. Lives at home alone     Action/Plan:   Will follow for discharge needs pending PT/OT evals and physician orders.   Anticipated DC Date:     Anticipated DC Plan:  HOME/SELF CARE         Choice offered to / List presented to:             Status of service:  In process, will continue to follow Medicare Important Message given?  NA - LOS <3 / Initial given by admissions (If response is "NO", the following Medicare IM given date fields will be blank) Date Medicare IM given:   Medicare IM given by:   Date Additional Medicare IM given:   Additional Medicare IM given by:    Discharge Disposition:    Per UR Regulation:  Reviewed for med. necessity/level of care/duration of stay  If discussed at Long Length of Stay Meetings, dates discussed:    Comments:

## 2013-09-10 ENCOUNTER — Telehealth: Payer: Self-pay | Admitting: *Deleted

## 2013-09-10 NOTE — Telephone Encounter (Signed)
Canceling appointment for tomorrow, he has some other appointments for his neck.  In the mean time, he's not taking the Gabapentin, he's taking the  B-12 for his foot.  We had read the side affects and it said suicidal, memory.  He was having some memory problems.  He's better now, we are working out his meds.  Call me if you have any questions, don't call his house.   We will call back to reschedule.

## 2013-09-12 ENCOUNTER — Ambulatory Visit: Payer: Medicare Other

## 2013-10-11 ENCOUNTER — Telehealth: Payer: Self-pay | Admitting: Neurology

## 2013-10-11 NOTE — Telephone Encounter (Signed)
Pt called to cancel his f/u appt for 11/06/13. Pt does not feel he needs to be seen.

## 2013-11-06 ENCOUNTER — Ambulatory Visit: Payer: Medicare Other | Admitting: Neurology

## 2014-10-17 IMAGING — CT CT HEAD W/O CM
4 series · 16 of 30 positions shown, 19 images · non-contrast
Comparison: Cervical radiographs from today

CLINICAL DATA: Fall.  Evaluate cervical fracture

EXAM:
CT HEAD WITHOUT CONTRAST
CT CERVICAL SPINE WITHOUT CONTRAST
TECHNIQUE: Multidetector CT imaging of the head and cervical spine was
performed following the standard protocol without intravenous
contrast. Multiplanar CT image reconstructions of the cervical spine
were also generated.

[Series 2: head w/o · axial · non-contrast · 0.43mm/px · z∈[+1275,+1330]mm · 2 of 33 slices shown]
[im 11/33  brain]
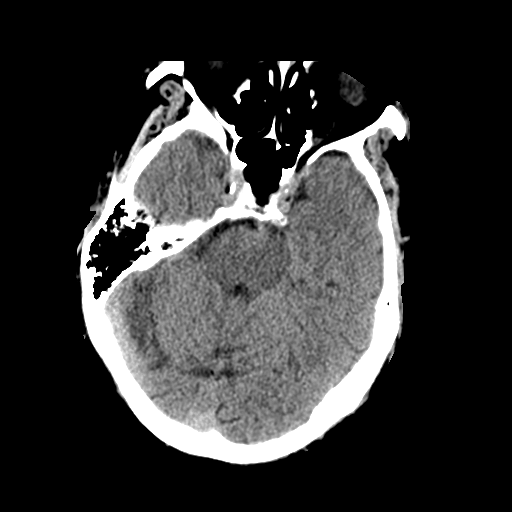
[im 22/33  brain]
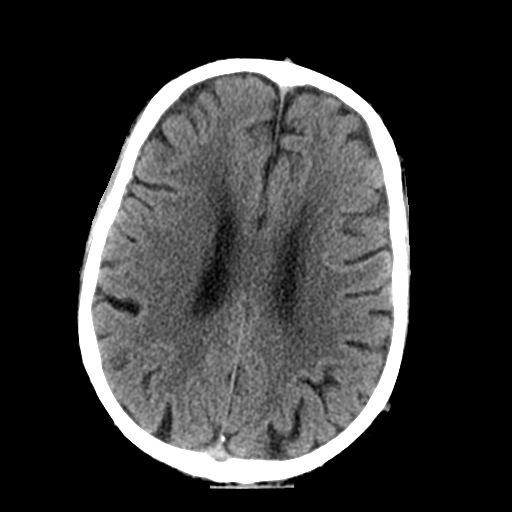

[Series 3: bone windows · axial · 0.43mm/px · z∈[+1275,+1330]mm · 2 of 33 slices shown]
[im 11/33  bone]
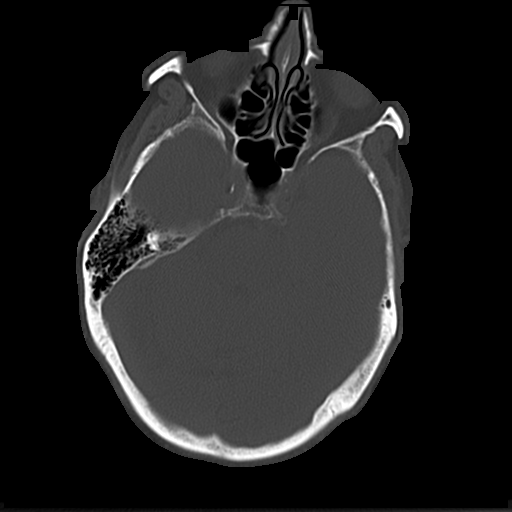
[im 22/33  bone]
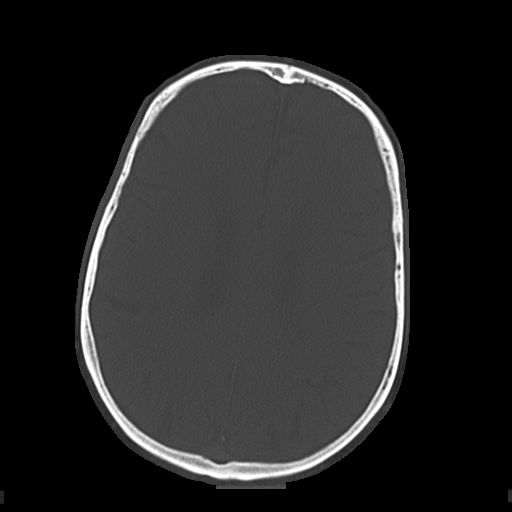

[Series 4: c-spine st · axial · 0.26mm/px · z∈[+1081,+1119]mm · 3 of 95 slices shown]
[im 10/95  brain]
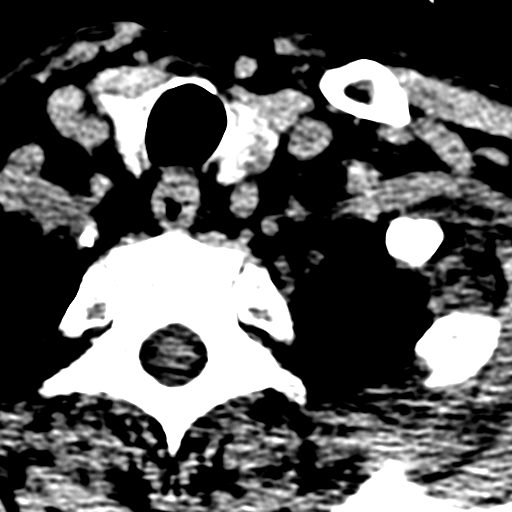
[im 19/95  brain]
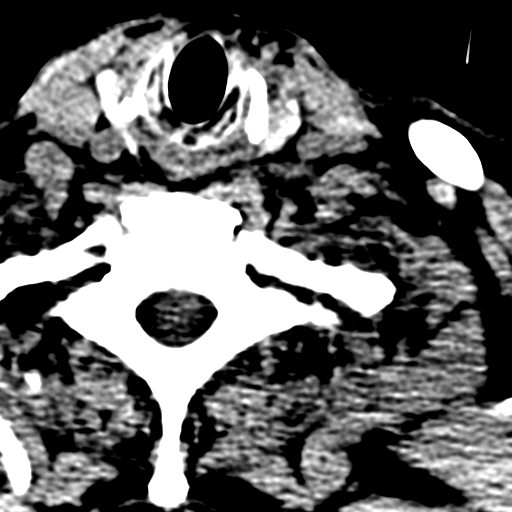
[im 29/95  brain]
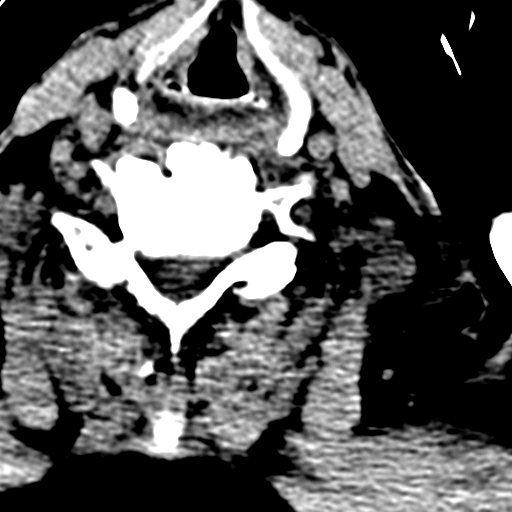

[Series 7: axial recon · axial · 0.23mm/px · z∈[+1071,+1215]mm · 9 of 94 slices shown, 12 images]
[im 10/94  brain]
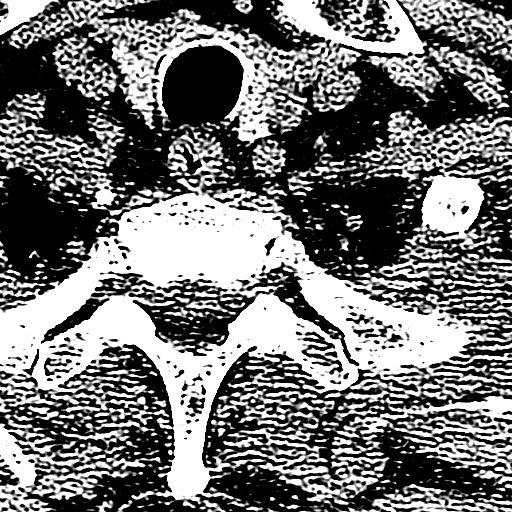
[im 10/94  bone]
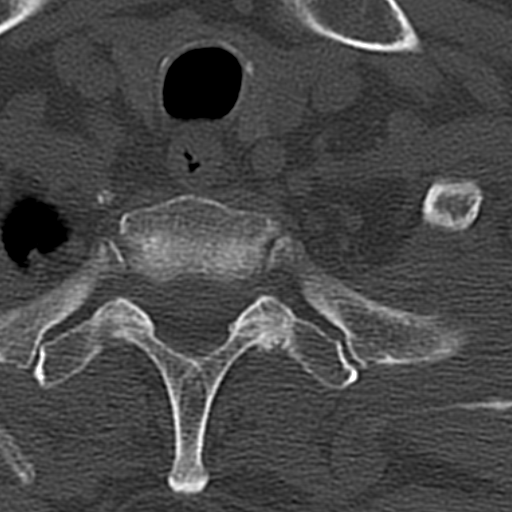
[im 19/94  brain]
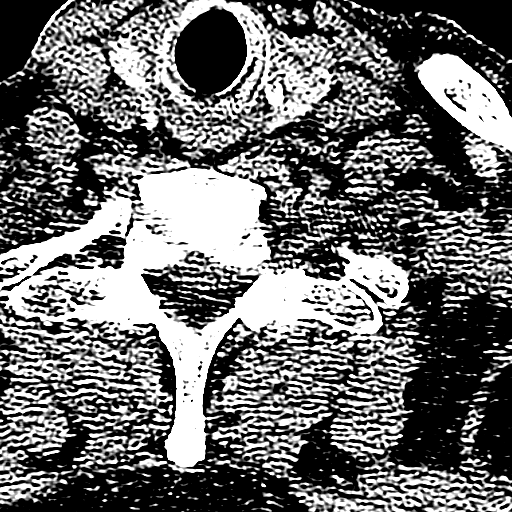
[im 28/94  brain]
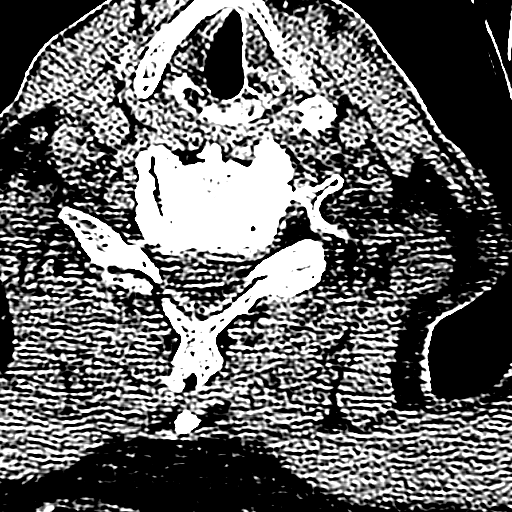
[im 38/94  brain]
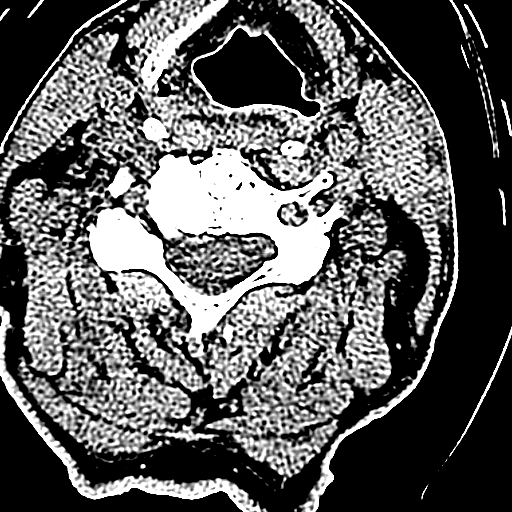
[im 47/94  brain]
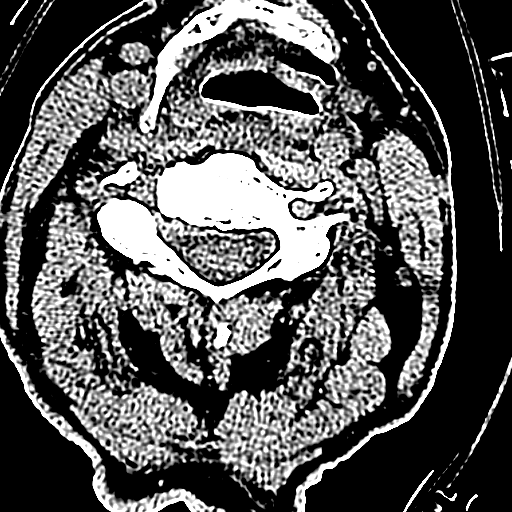
[im 47/94  bone]
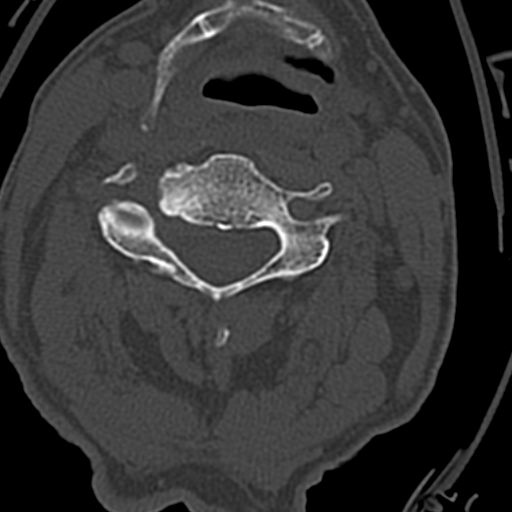
[im 56/94  brain]
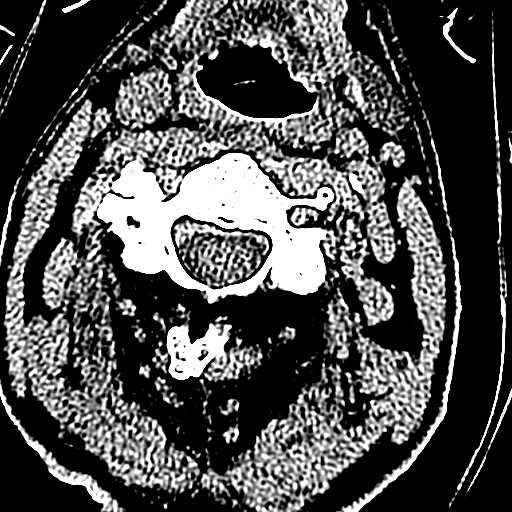
[im 66/94  brain]
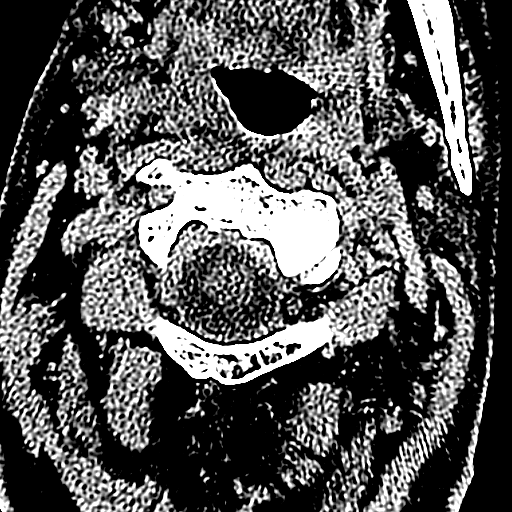
[im 75/94  brain]
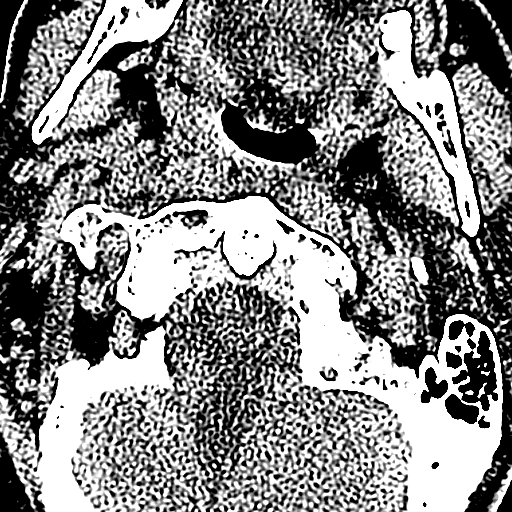
[im 84/94  brain]
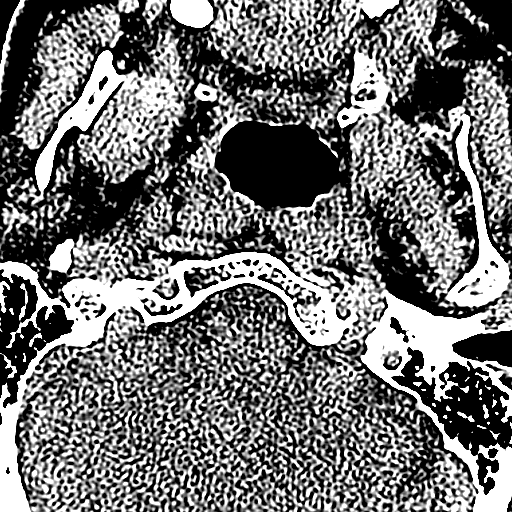
[im 84/94  bone]
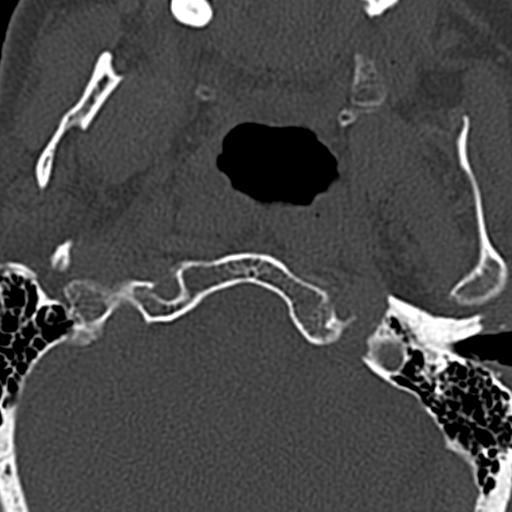

[16 of 30 positions shown; findings below may reference images not displayed]

FINDINGS: CT HEAD FINDINGS

Generalized atrophy with chronic microvascular ischemia. Negative
for acute infarct. Negative for intracranial hemorrhage or mass.
Negative for skull fracture.

CT CERVICAL SPINE FINDINGS

Type 2 fracture of the dens with 3 mm posterior displacement of the
dens. No significant spinal stenosis. No other acute fracture

Marked right foraminal encroachment due to uncinate spurring and
facet hypertrophy. Moderate right foraminal encroachment at C4-5 due
to spurring. Right foraminal narrowing at C5-6 due to spurring.
Spondylosis with foraminal encroachment bilaterally at C6-7. 2 mm
anterior slip C7-T1 secondary to facet degeneration.
IMPRESSION: No acute intracranial abnormality.

Type 2 fracture of the dens with 3 mm posterior displacement of the
dens.

## 2014-10-17 IMAGING — CR DG CERVICAL SPINE COMPLETE 4+V
8 series · 8 of 8 positions shown · non-contrast
Comparison: None.

CLINICAL DATA: Pain post trauma

EXAM:
CERVICAL SPINE  4+ VIEWS

[w cervical spine lat]
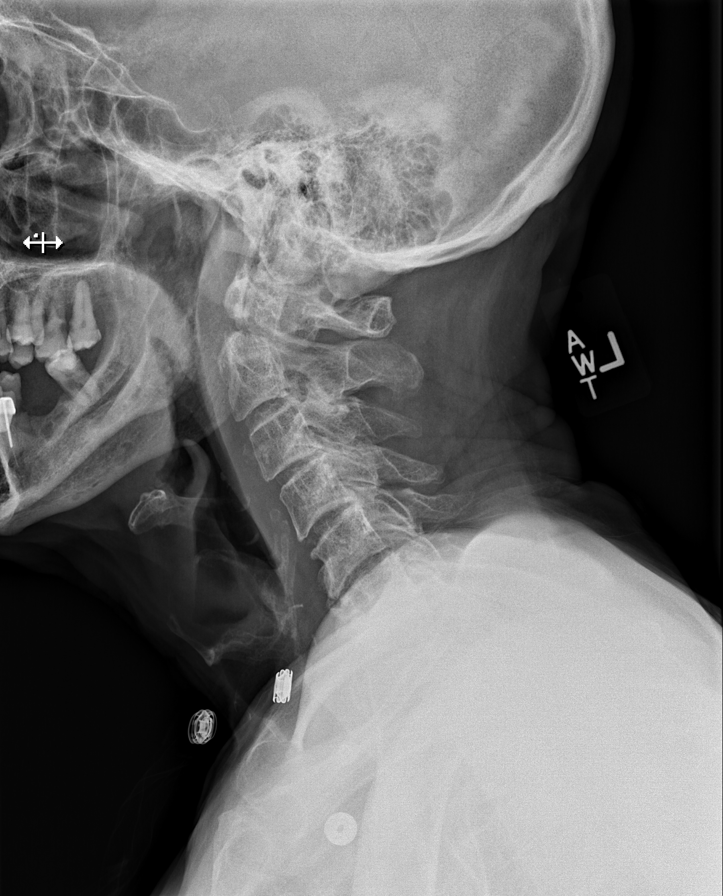

[w cervical spine pa_obl (1 of 3)]
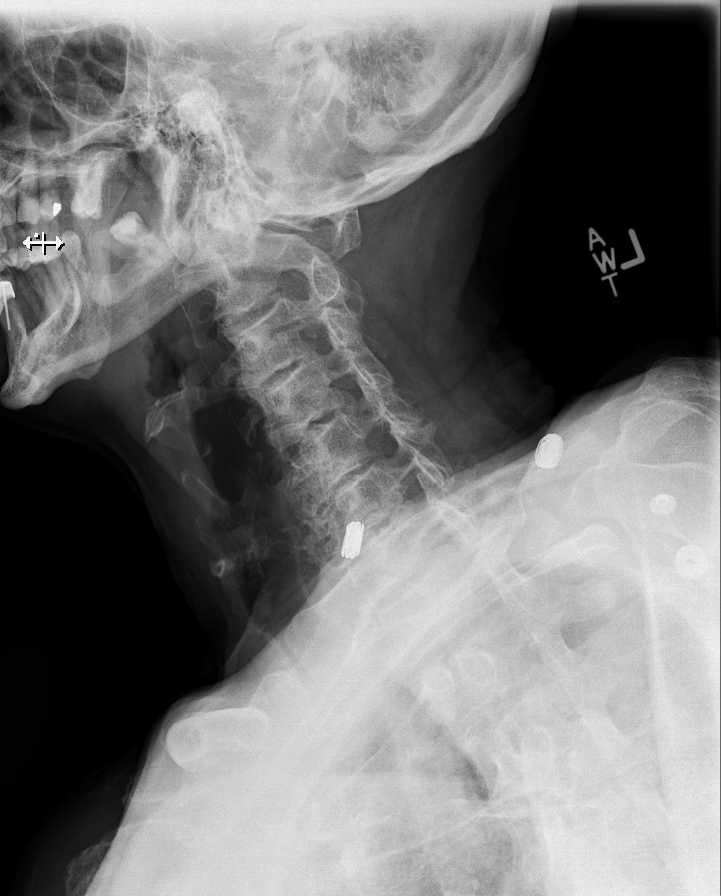

[w cervical spine pa_obl (2 of 3)]
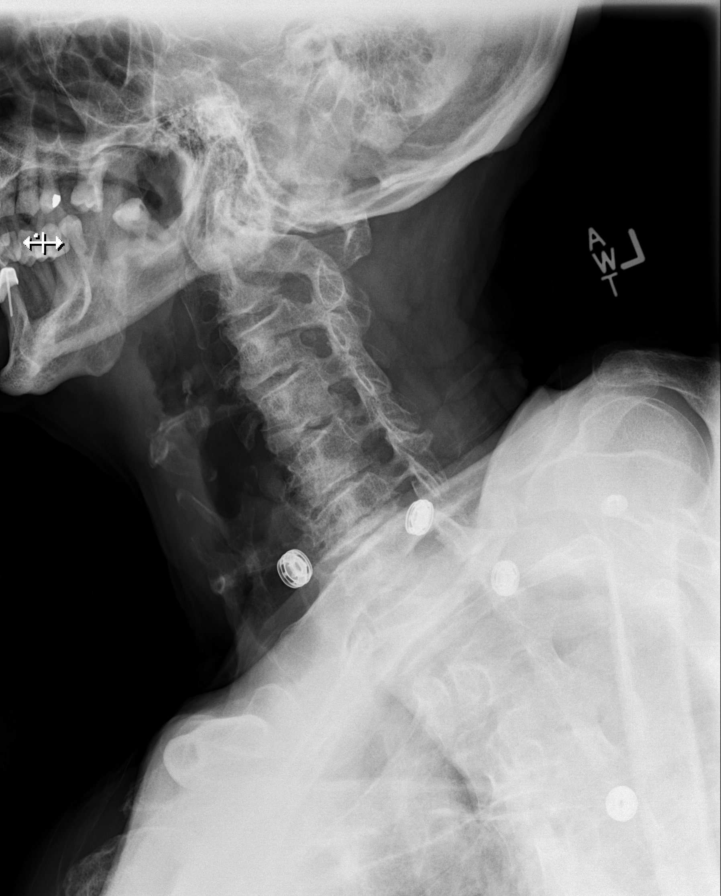

[w cervical spine pa_obl (3 of 3)]
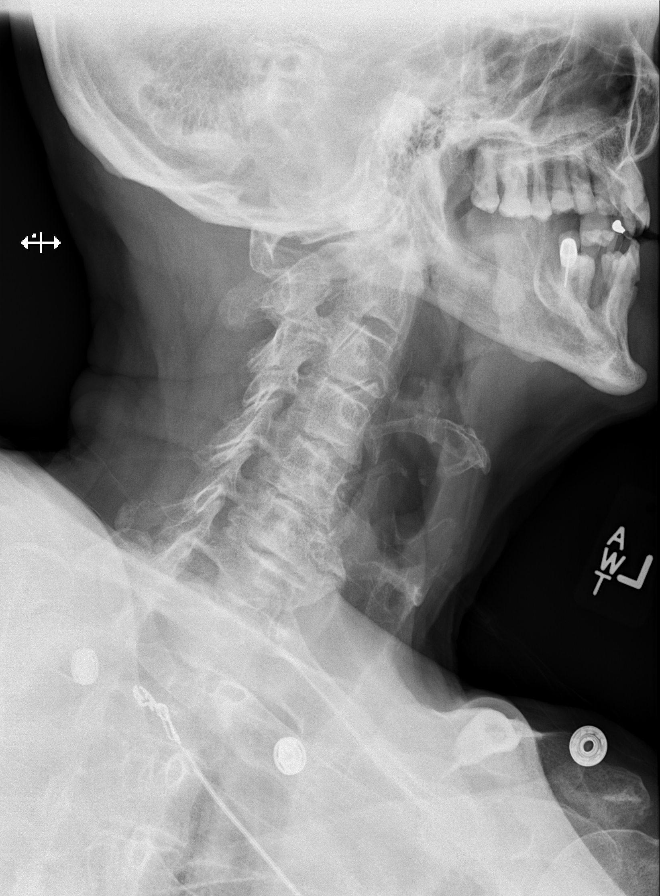

[w cervical spine ap]
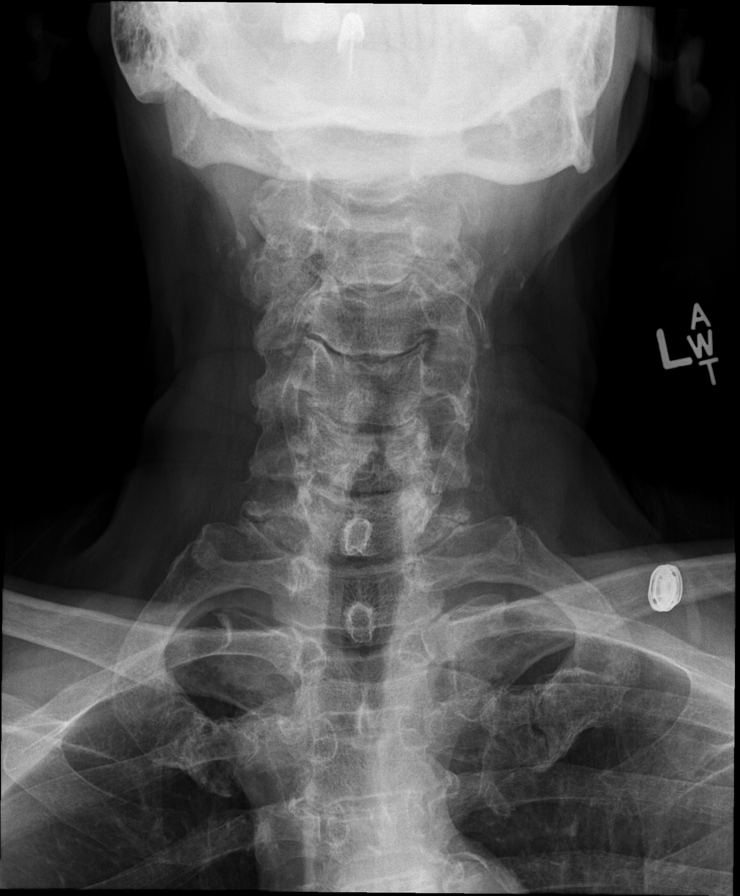

[w cervical spine odontoid (1 of 2)]
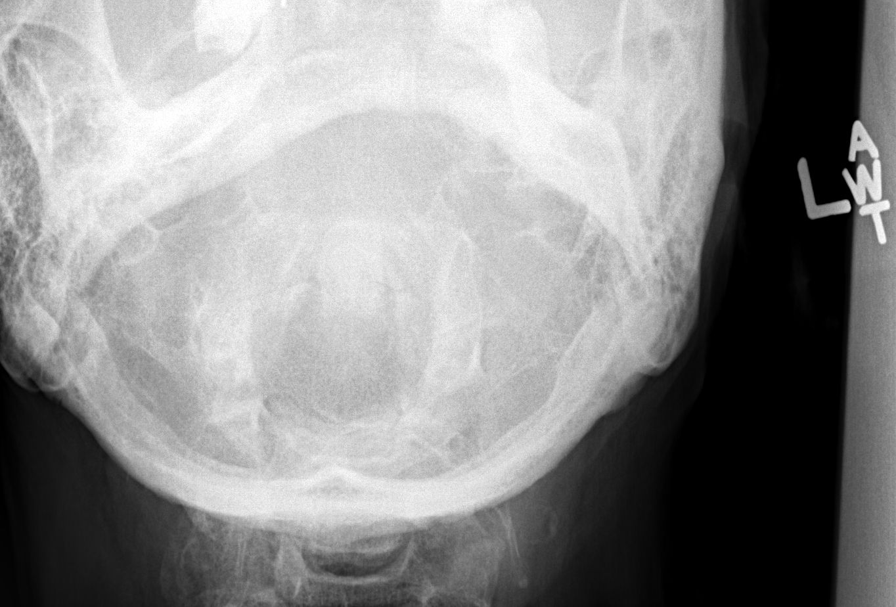

[w cervical spine odontoid (2 of 2)]
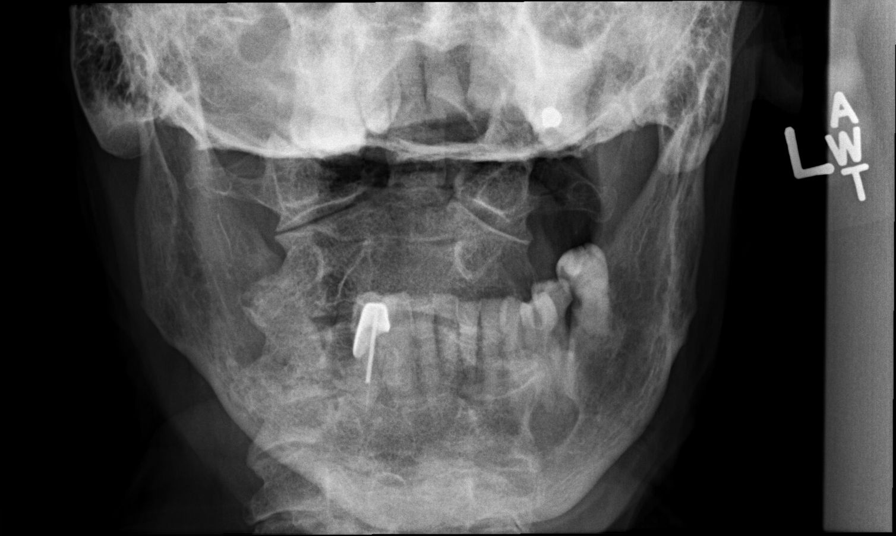

[w cervical swimmers]
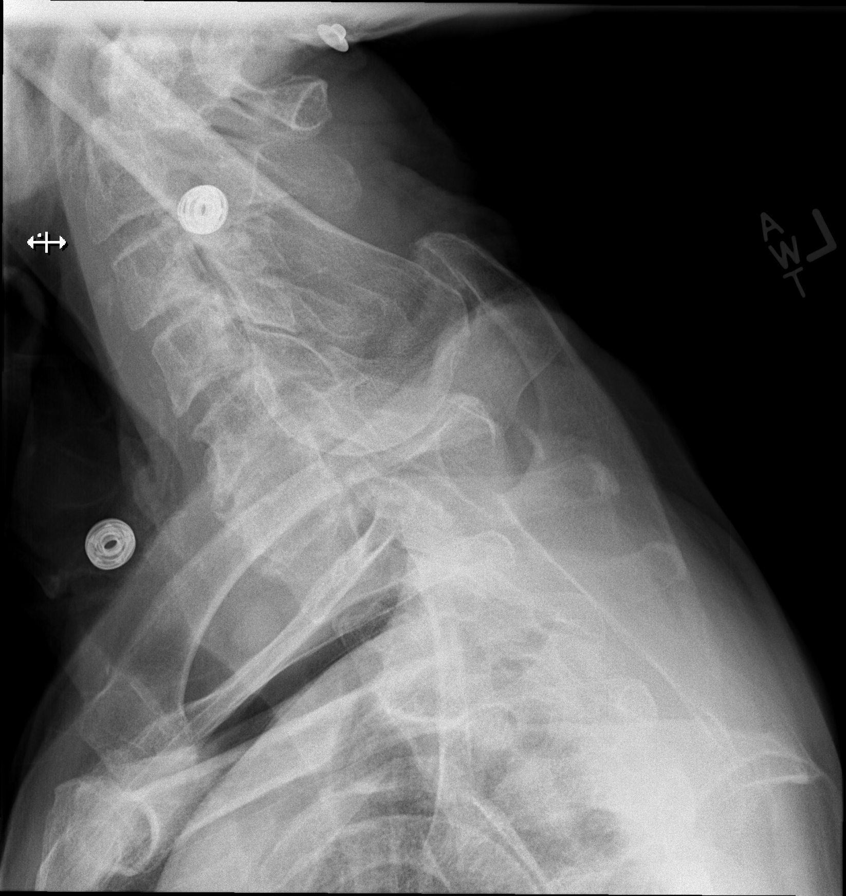

[8 of 8 positions shown; findings below may reference images not displayed]

FINDINGS: All, lateral, open-mouth odontoid, and bilateral oblique views were
obtained. There is a fracture through the base of the odontoid with
mild posterior displacement of the odontoid with respect to the
remainder of C2. No other fracture is apparent. Prevertebral soft
tissues and predental space regions are normal. There is moderately
severe disc space narrowing at C5-6 and C6-7. There is mild disc
space narrowing at C3-4, C4-5, and C7-T1. There is facet hypertrophy
to varying degrees at all levels consistent with multifocal
osteoarthritis. There is carotid artery calcification bilaterally.
IMPRESSION: Displaced fracture of the base of the odontoid with displacement the
odontoid posteriorly with respect to the remainder of C2. No other
fractures. Multilevel osteoarthritic change. There is carotid artery
calcification bilaterally.

Critical Value/emergent results were called by telephone at the time
of interpretation on 08/27/2013 at [DATE] to Dr. ZEN SCHMID , who
verbally acknowledged these results.

## 2015-10-26 DIAGNOSIS — 419620001 Death: Secondary | SNOMED CT | POA: Diagnosis not present

## 2015-10-26 DEATH — deceased
